# Patient Record
Sex: Female | Born: 1975 | Race: White | Hispanic: No | Marital: Single | Smoking: Never smoker
Health system: Southern US, Community
[De-identification: ages and names within clinical notes are randomized; demographics above are authoritative.]

## PROBLEM LIST (undated history)

## (undated) DIAGNOSIS — R112 Nausea with vomiting, unspecified: Secondary | ICD-10-CM

## (undated) DIAGNOSIS — Z9889 Other specified postprocedural states: Secondary | ICD-10-CM

## (undated) DIAGNOSIS — K219 Gastro-esophageal reflux disease without esophagitis: Secondary | ICD-10-CM

## (undated) DIAGNOSIS — T4145XA Adverse effect of unspecified anesthetic, initial encounter: Secondary | ICD-10-CM

## (undated) DIAGNOSIS — T8859XA Other complications of anesthesia, initial encounter: Secondary | ICD-10-CM

## (undated) HISTORY — PX: CHOLECYSTECTOMY: SHX55

---

## 1999-07-24 ENCOUNTER — Other Ambulatory Visit: Admission: RE | Admit: 1999-07-24 | Discharge: 1999-07-24 | Payer: Self-pay | Admitting: Obstetrics and Gynecology

## 2000-08-01 ENCOUNTER — Other Ambulatory Visit: Admission: RE | Admit: 2000-08-01 | Discharge: 2000-08-01 | Payer: Self-pay | Admitting: Obstetrics and Gynecology

## 2001-08-07 ENCOUNTER — Other Ambulatory Visit: Admission: RE | Admit: 2001-08-07 | Discharge: 2001-08-07 | Payer: Self-pay | Admitting: Obstetrics and Gynecology

## 2002-08-17 ENCOUNTER — Other Ambulatory Visit: Admission: RE | Admit: 2002-08-17 | Discharge: 2002-08-17 | Payer: Self-pay | Admitting: Obstetrics and Gynecology

## 2003-05-04 ENCOUNTER — Encounter: Admission: RE | Admit: 2003-05-04 | Discharge: 2003-05-04 | Payer: Self-pay | Admitting: Plastic Surgery

## 2003-09-02 ENCOUNTER — Other Ambulatory Visit: Admission: RE | Admit: 2003-09-02 | Discharge: 2003-09-02 | Payer: Self-pay | Admitting: Obstetrics and Gynecology

## 2004-03-22 ENCOUNTER — Inpatient Hospital Stay (HOSPITAL_COMMUNITY): Admission: AD | Admit: 2004-03-22 | Discharge: 2004-03-22 | Payer: Self-pay | Admitting: Obstetrics and Gynecology

## 2004-03-23 ENCOUNTER — Ambulatory Visit (HOSPITAL_COMMUNITY): Admission: RE | Admit: 2004-03-23 | Discharge: 2004-03-23 | Payer: Self-pay | Admitting: Obstetrics and Gynecology

## 2004-04-11 ENCOUNTER — Inpatient Hospital Stay (HOSPITAL_COMMUNITY): Admission: AD | Admit: 2004-04-11 | Discharge: 2004-04-11 | Payer: Self-pay | Admitting: Obstetrics and Gynecology

## 2004-06-28 ENCOUNTER — Inpatient Hospital Stay (HOSPITAL_COMMUNITY): Admission: RE | Admit: 2004-06-28 | Discharge: 2004-07-01 | Payer: Self-pay | Admitting: Obstetrics and Gynecology

## 2004-07-02 ENCOUNTER — Encounter: Admission: RE | Admit: 2004-07-02 | Discharge: 2004-07-30 | Payer: Self-pay | Admitting: Obstetrics and Gynecology

## 2004-08-07 ENCOUNTER — Other Ambulatory Visit: Admission: RE | Admit: 2004-08-07 | Discharge: 2004-08-07 | Payer: Self-pay | Admitting: Obstetrics and Gynecology

## 2004-10-07 IMAGING — US US ABDOMEN COMPLETE
1 series · 14 of 25 positions shown · non-contrast
Comparison: none

CLINICAL DATA: Abdominal pain and vomiting.
 ABDOMINAL ULTRASOUND
 Normal gallbladder.  Common duct measures 3 mm in diameter which is well within normal limits.  No hepatic or splenic abnormalities.  The spleen measures 12 cm in length.  No definite pancreatic abnormality.  The pancreatic tail was obscured and therefore could not be examined.  No ascites.  Right and left kidneys measure 10.9 and 11.4 cm in length respectively.  Normal caliber of the abdominal aorta.  
 IMPRESSION
 Unremarkable abdominal ultrasound as described above.

[Series 1: unknown · 0.25mm/px · 14 of 42 slices shown]
[im 1/42]
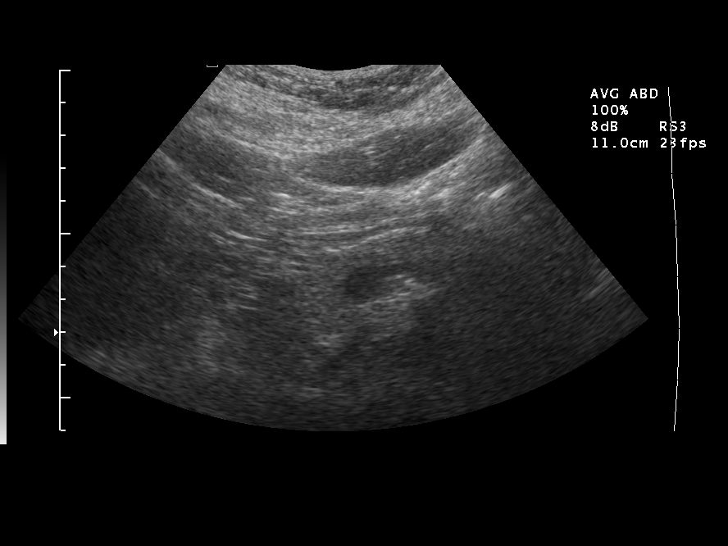
[im 4/42]
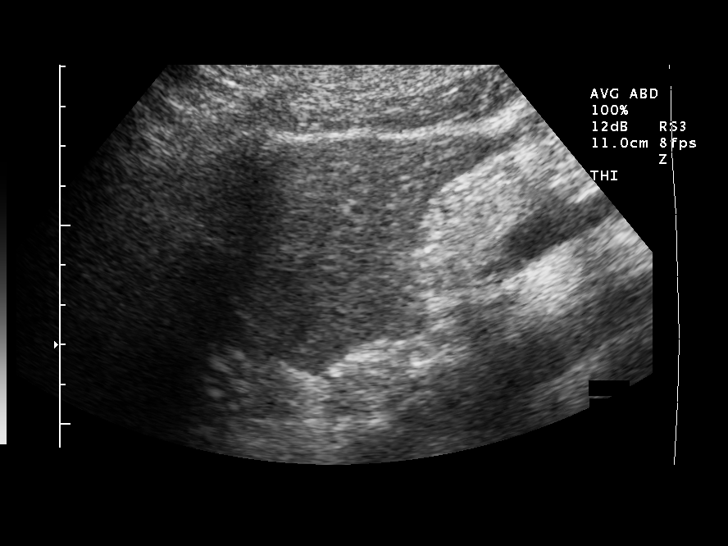
[im 7/42]
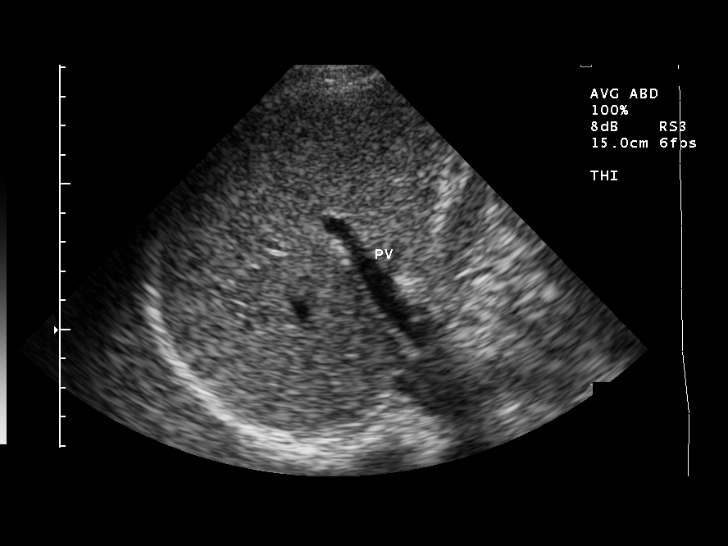
[im 11/42]
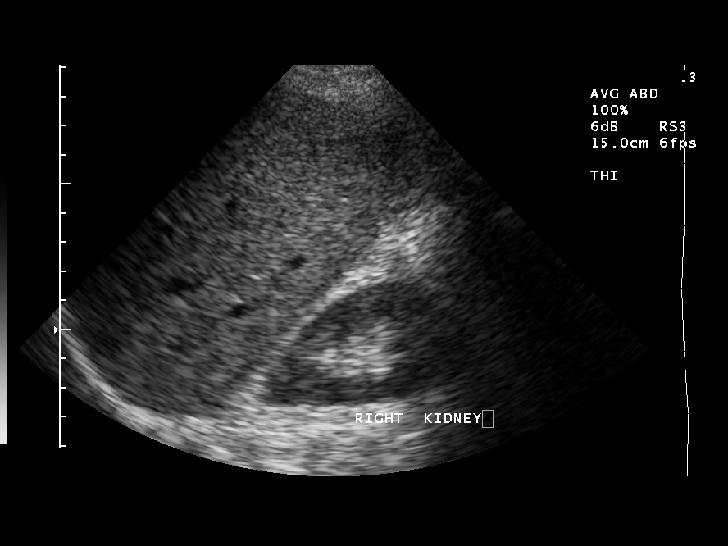
[im 14/42]
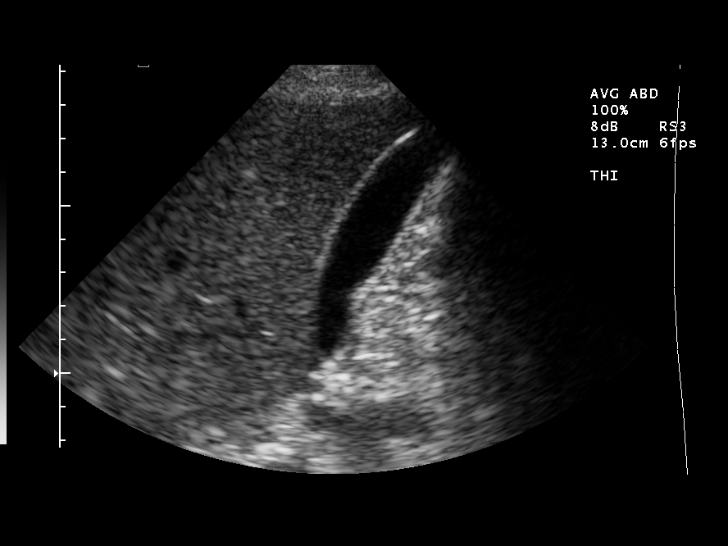
[im 16/42]
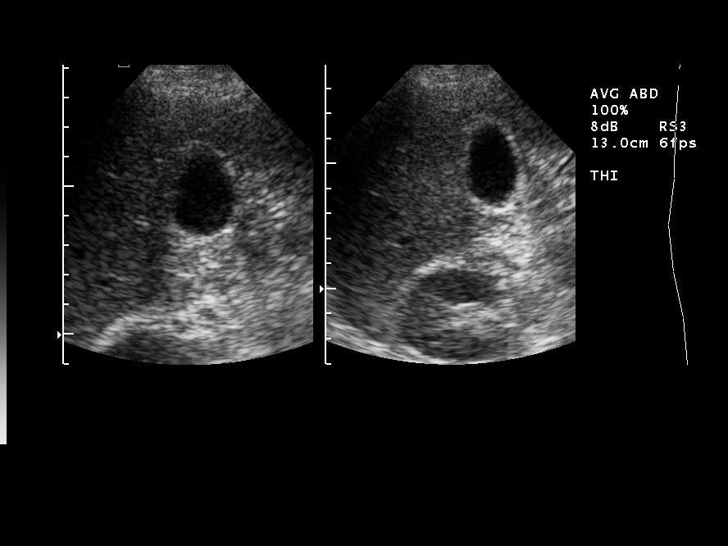
[im 19/42]
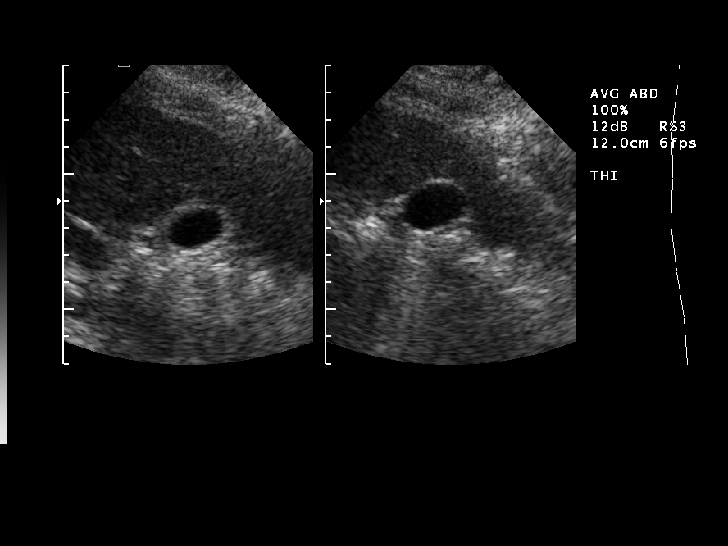
[im 23/42]
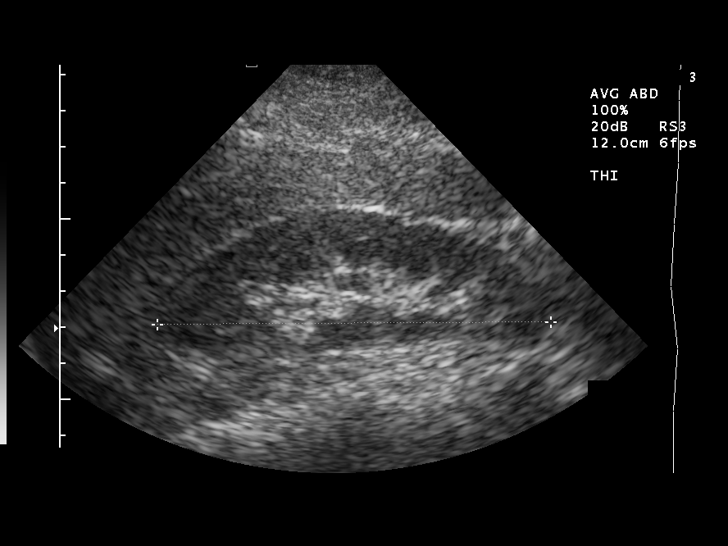
[im 26/42]
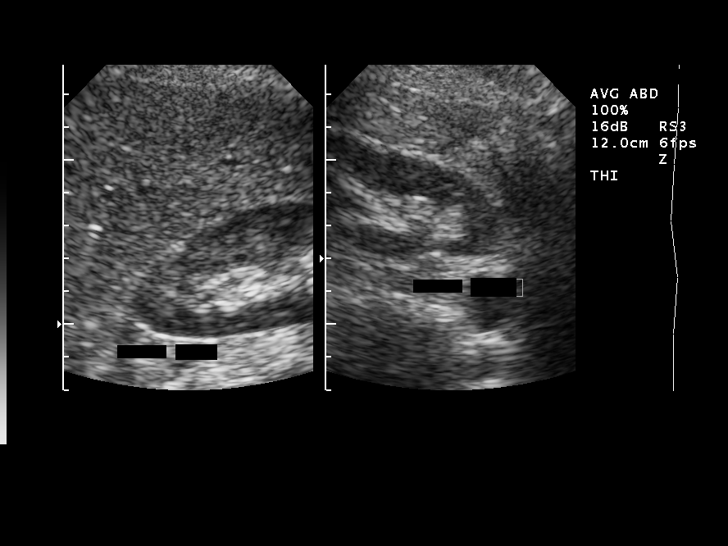
[im 28/42]
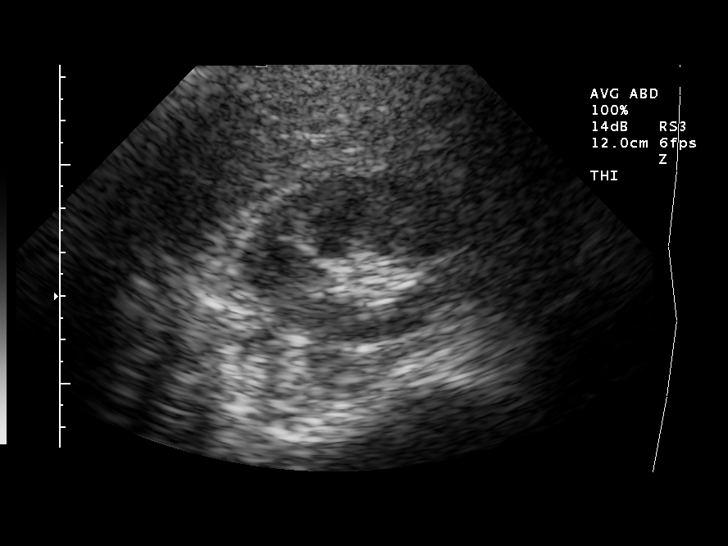
[im 31/42]
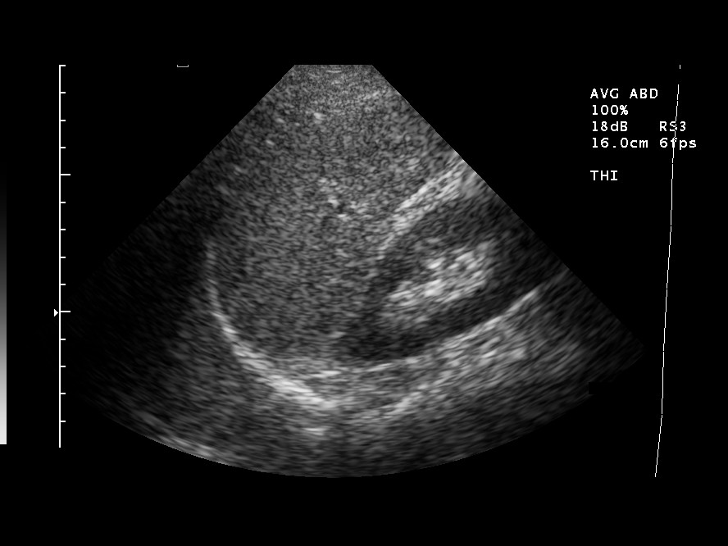
[im 35/42]
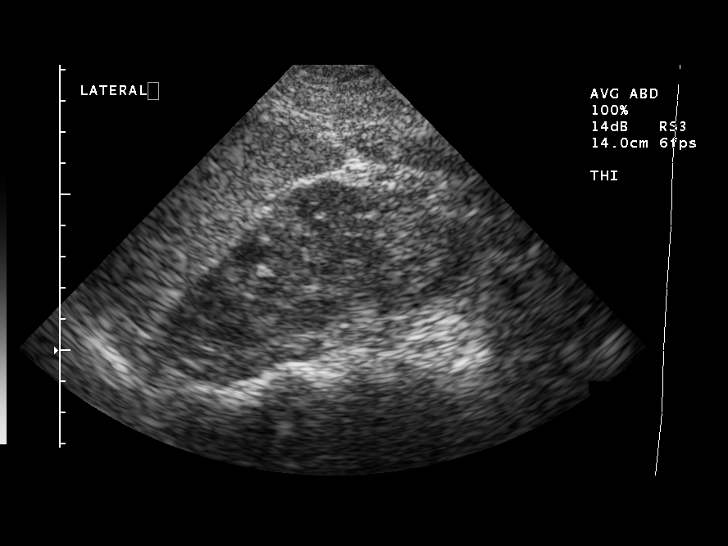
[im 38/42]
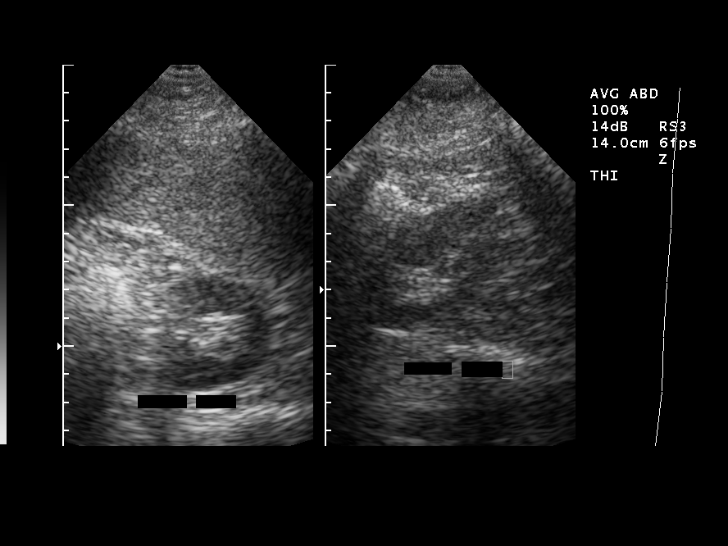
[im 42/42]
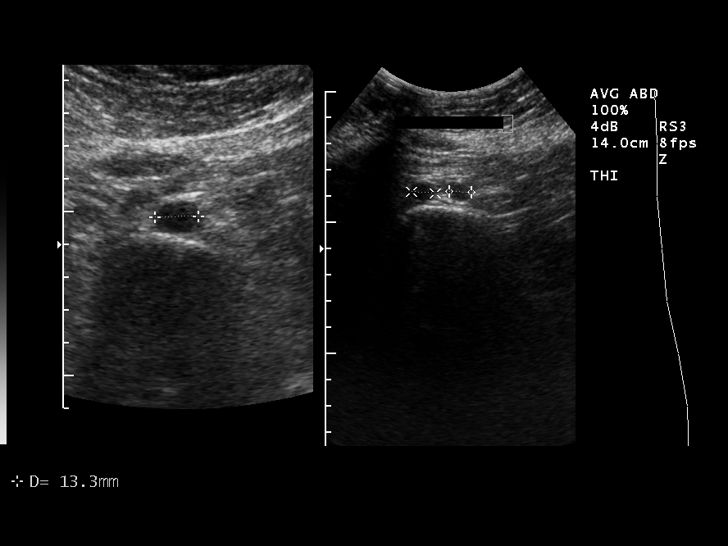

[14 of 25 positions shown; findings below may reference images not displayed]

## 2005-09-15 IMAGING — US US FETAL BPP W/O NONSTRESS
1 series · 14 of 28 positions shown · non-contrast
Comparison: none

CLINICAL DATA: Decreased fetal movement.

[Series 1: us fetal bpp w/o nonstress · 0.35mm/px · 14 of 50 slices shown]
[im 2/50]
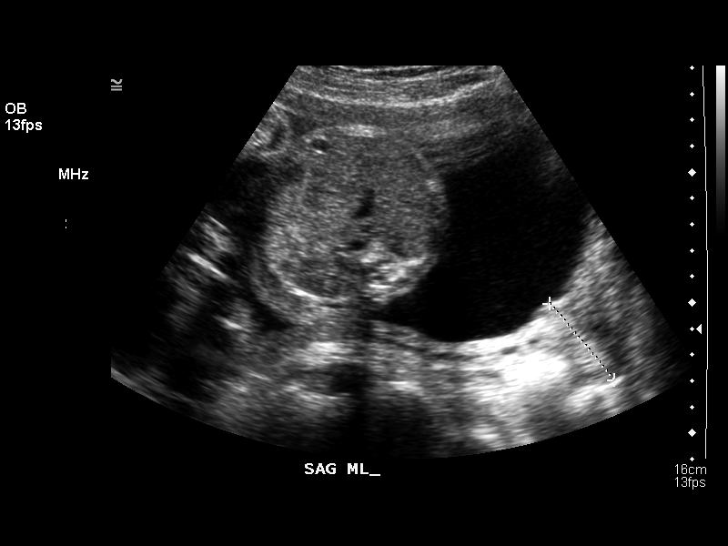
[im 6/50]
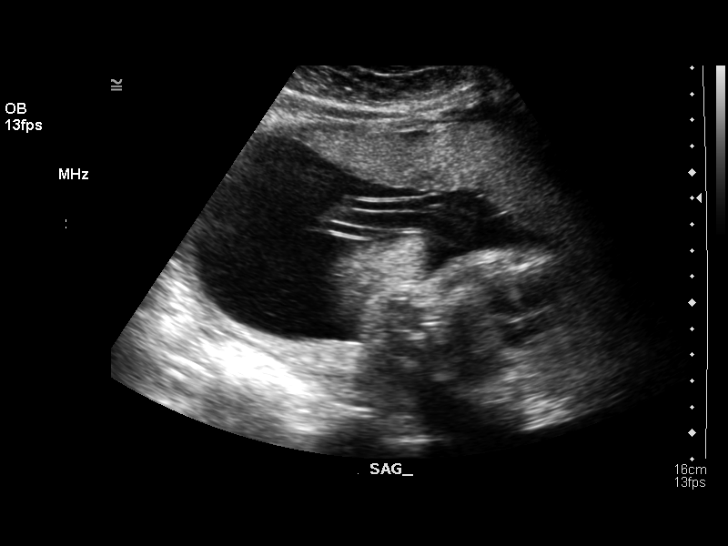
[im 10/50]
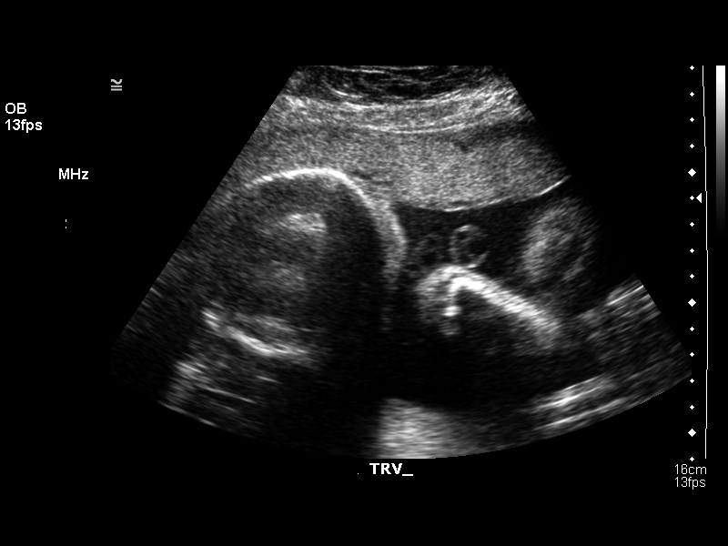
[im 13/50]
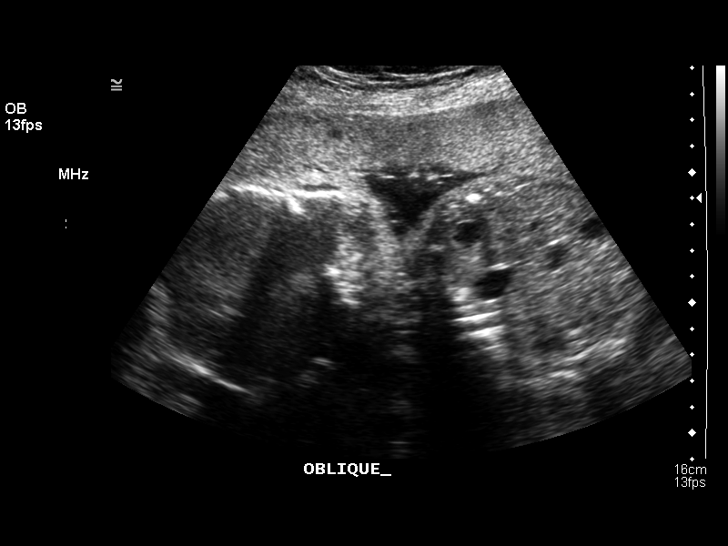
[im 17/50]
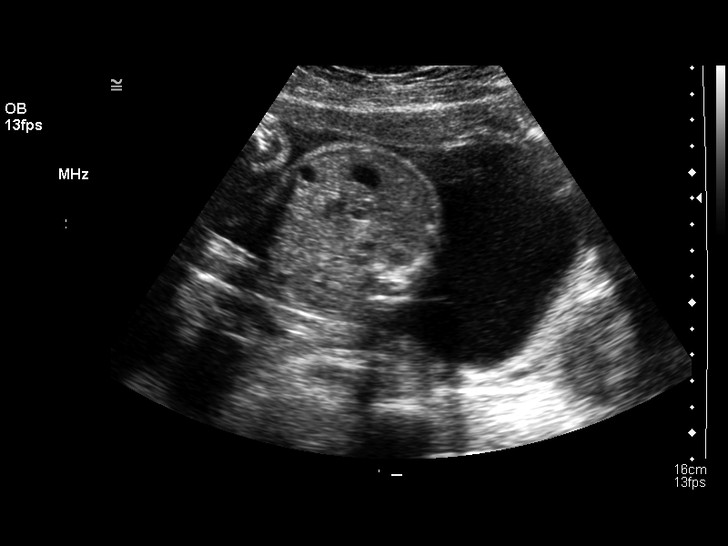
[im 20/50]
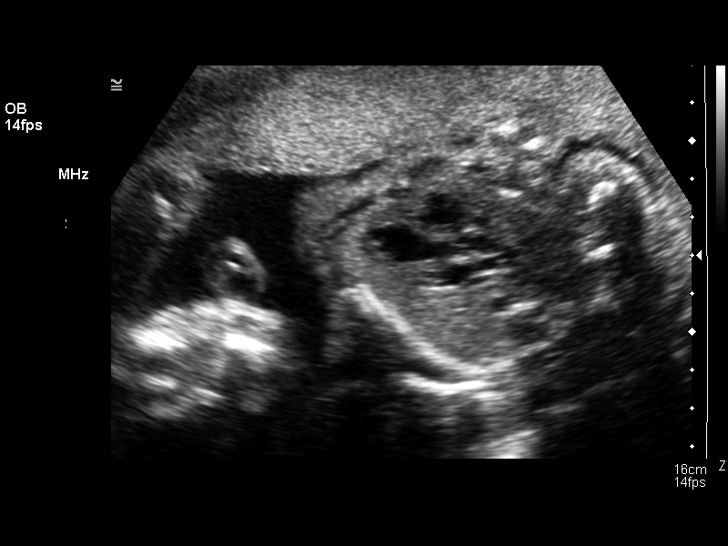
[im 24/50]
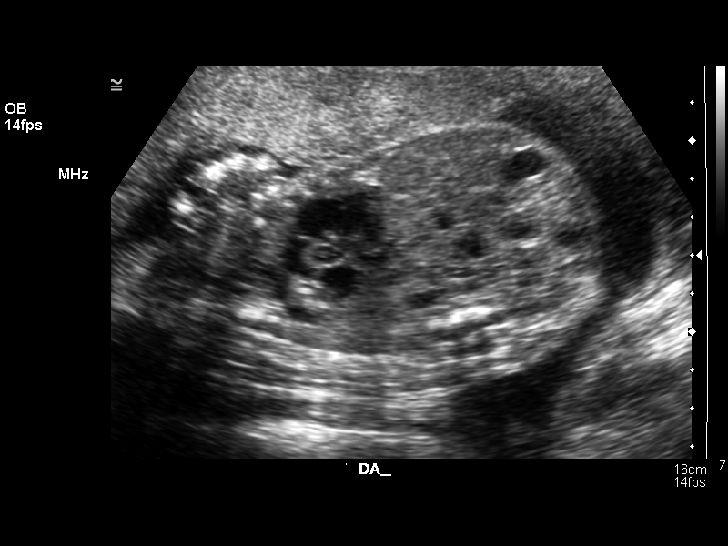
[im 28/50]
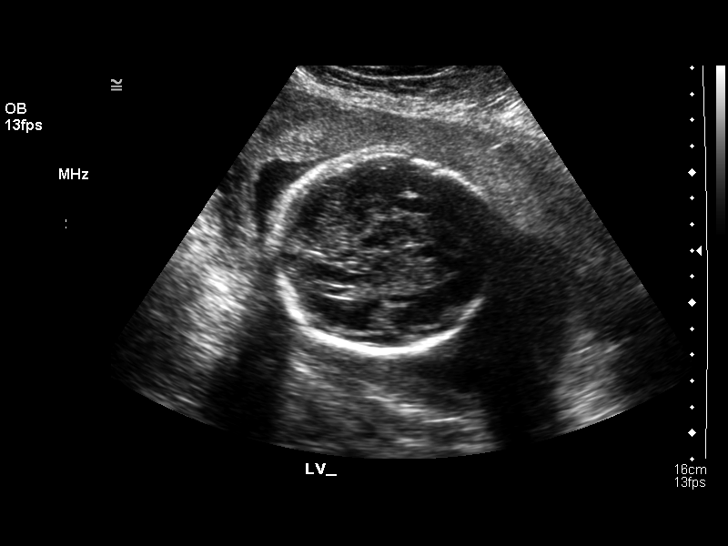
[im 31/50]
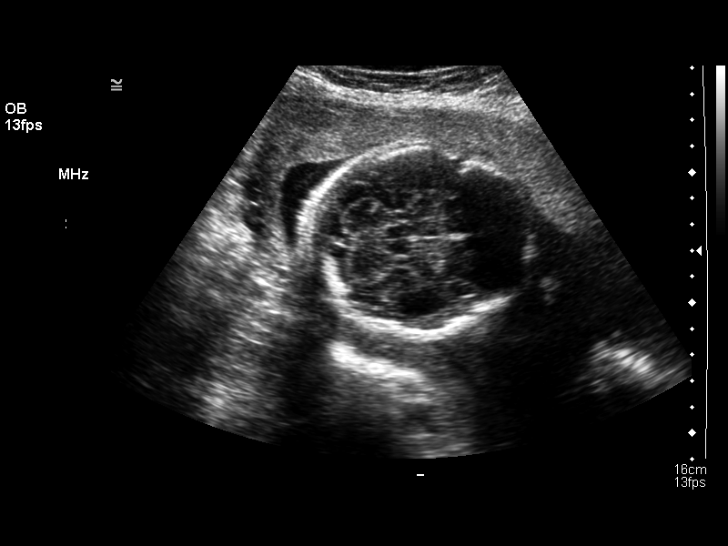
[im 35/50]
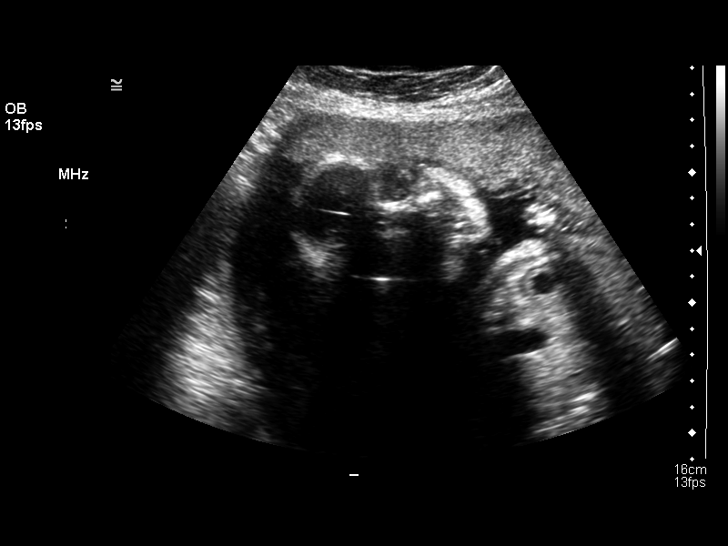
[im 39/50]
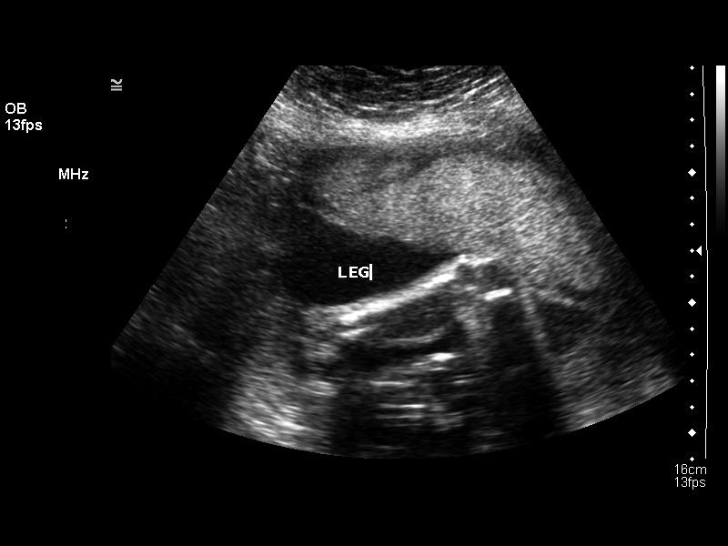
[im 42/50]
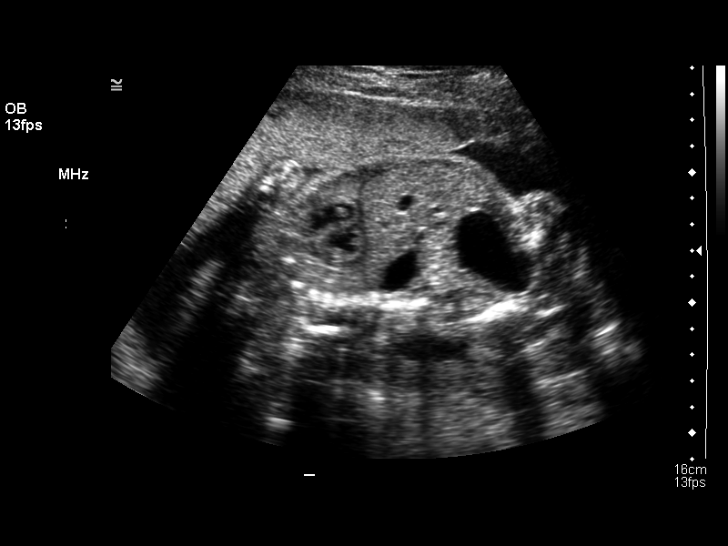
[im 46/50]
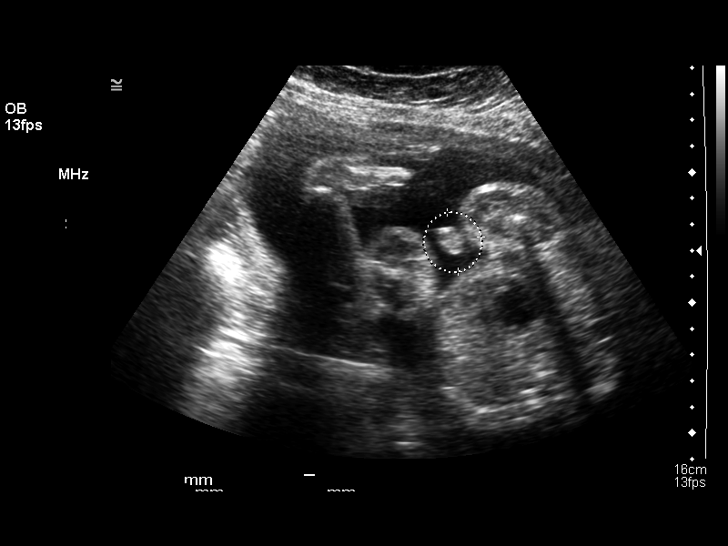
[im 50/50]
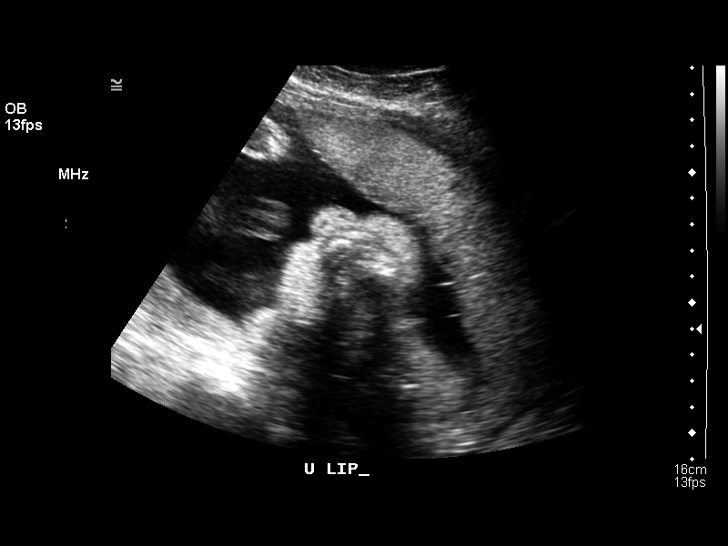

[14 of 28 positions shown; findings below may reference images not displayed]

BIOPHYSICAL PROFILE

 Number of Fetuses:  1
 Heart rate:  139
 Movement:  Yes
 Breathing:  Yes
 Presentation:  Oblique
 Placental Location:  Anterior
 Grade:  I
 Previa:  No
 Amniotic Fluid (Subjective):  Normal
 Amniotic Fluid (Objective):  19.0 cm AFI (5th -95th%ile = 9.4 ? 22.8 cm for 28 wks)

 Fetal measurements and complete anatomic evaluation were not requested.  The following fetal anatomy was visualized on this exam:  Lateral ventricles, thalami, posterior fossa, four chamber heart, stomach, 3-vessel cord, cord insertion site, kidneys, bladder, LVOT, RVOT, upper lip, orbits, profile, diaphragm, ductal arch, aortic arch, and male genitalia.

 BPP SCORING
 Movements:  2  Time:  20 minutes
 Breathing:  2
 Tone:  2
 Amniotic Fluid:  2
 Total Score:  8

 MATERNAL FINDINGS:
 Cervix:  4.0 cm Transabdominally
IMPRESSION: Normal biophysical profile score.

## 2005-09-20 ENCOUNTER — Other Ambulatory Visit: Admission: RE | Admit: 2005-09-20 | Discharge: 2005-09-20 | Payer: Self-pay | Admitting: Obstetrics and Gynecology

## 2010-03-10 ENCOUNTER — Emergency Department (HOSPITAL_COMMUNITY): Admission: EM | Admit: 2010-03-10 | Discharge: 2010-03-10 | Payer: Self-pay | Admitting: Emergency Medicine

## 2010-07-30 ENCOUNTER — Emergency Department (HOSPITAL_COMMUNITY): Payer: 59

## 2010-07-30 ENCOUNTER — Emergency Department (HOSPITAL_COMMUNITY)
Admission: EM | Admit: 2010-07-30 | Discharge: 2010-07-30 | Disposition: A | Discharge status: Home / Self Care | Payer: 59 | Attending: Emergency Medicine | Admitting: Emergency Medicine

## 2010-07-30 DIAGNOSIS — R110 Nausea: Secondary | ICD-10-CM | POA: Insufficient documentation

## 2010-07-30 DIAGNOSIS — R1011 Right upper quadrant pain: Secondary | ICD-10-CM | POA: Insufficient documentation

## 2010-07-30 LAB — URINALYSIS, ROUTINE W REFLEX MICROSCOPIC
Bilirubin Urine: NEGATIVE
Hgb urine dipstick: NEGATIVE
Ketones, ur: NEGATIVE mg/dL
Nitrite: NEGATIVE
Protein, ur: NEGATIVE mg/dL
Specific Gravity, Urine: 1.009 (ref 1.005–1.030)
Urine Glucose, Fasting: NEGATIVE mg/dL
Urobilinogen, UA: 0.2 mg/dL (ref 0.0–1.0)
pH: 6 (ref 5.0–8.0)

## 2010-07-30 LAB — COMPREHENSIVE METABOLIC PANEL
ALT: 16 U/L (ref 0–35)
AST: 16 U/L (ref 0–37)
Albumin: 3.5 g/dL (ref 3.5–5.2)
Alkaline Phosphatase: 49 U/L (ref 39–117)
BUN: 6 mg/dL (ref 6–23)
CO2: 24 mEq/L (ref 19–32)
Calcium: 8.4 mg/dL (ref 8.4–10.5)
Chloride: 111 mEq/L (ref 96–112)
Creatinine, Ser: 0.67 mg/dL (ref 0.4–1.2)
GFR calc Af Amer: >60 mL/min (ref >60)
GFR calc non Af Amer: >60 mL/min (ref >60)
Glucose, Bld: 87 mg/dL (ref 70–99)
Potassium: 4.3 mEq/L (ref 3.5–5.1)
Sodium: 140 mEq/L (ref 135–145)
Total Bilirubin: 0.4 mg/dL (ref 0.3–1.2)
Total Protein: 6.4 g/dL (ref 6.0–8.3)

## 2010-07-30 LAB — POCT PREGNANCY, URINE: Preg Test, Ur: NEGATIVE

## 2010-07-30 LAB — LIPASE, BLOOD: Lipase: 28 U/L (ref 11–59)

## 2010-07-31 LAB — POCT I-STAT, CHEM 8
BUN: 6 mg/dL (ref 6–23)
Calcium, Ion: 1.11 mmol/L — ABNORMAL LOW (ref 1.12–1.32)
Chloride: 105 mEq/L (ref 96–112)
Creatinine, Ser: 0.6 mg/dL (ref 0.4–1.2)
Glucose, Bld: 104 mg/dL — ABNORMAL HIGH (ref 70–99)
HCT: 39 % (ref 36.0–46.0)
Hemoglobin: 13.3 g/dL (ref 12.0–15.0)
Potassium: 3.4 mEq/L — ABNORMAL LOW (ref 3.5–5.1)
Sodium: 140 mEq/L (ref 135–145)
TCO2: 21 mmol/L (ref 0–100)

## 2010-07-31 LAB — CBC
HCT: 36.9 % (ref 36.0–46.0)
Hemoglobin: 13.1 g/dL (ref 12.0–15.0)
MCH: 32.3 pg (ref 26.0–34.0)
MCHC: 35.5 g/dL (ref 30.0–36.0)
MCV: 91.1 fL (ref 78.0–100.0)
Platelets: 231 10*3/uL (ref 150–400)
RBC: 4.05 MIL/uL (ref 3.87–5.11)
RDW: 12.1 % (ref 11.5–15.5)
WBC: 7 10*3/uL (ref 4.0–10.5)

## 2010-07-31 LAB — DIFFERENTIAL
Basophils Absolute: 0 10*3/uL (ref 0.0–0.1)
Basophils Relative: 0 % (ref 0–1)
Eosinophils Absolute: 0 10*3/uL (ref 0.0–0.7)
Eosinophils Relative: 0 % (ref 0–5)
Lymphocytes Relative: 23 % (ref 12–46)
Lymphs Abs: 1.6 10*3/uL (ref 0.7–4.0)
Monocytes Absolute: 0.5 10*3/uL (ref 0.1–1.0)
Monocytes Relative: 7 % (ref 3–12)
Neutro Abs: 4.9 10*3/uL (ref 1.7–7.7)
Neutrophils Relative %: 70 % (ref 43–77)

## 2010-08-07 ENCOUNTER — Other Ambulatory Visit (HOSPITAL_COMMUNITY): Payer: Self-pay | Admitting: Family Medicine

## 2010-08-07 DIAGNOSIS — R112 Nausea with vomiting, unspecified: Secondary | ICD-10-CM

## 2010-08-07 DIAGNOSIS — R1011 Right upper quadrant pain: Secondary | ICD-10-CM

## 2010-08-15 ENCOUNTER — Ambulatory Visit (HOSPITAL_COMMUNITY)
Admission: RE | Admit: 2010-08-15 | Discharge: 2010-08-15 | Disposition: A | Discharge status: Home / Self Care | Payer: 59 | Source: Ambulatory Visit | Attending: Family Medicine | Admitting: Family Medicine

## 2010-08-15 DIAGNOSIS — R109 Unspecified abdominal pain: Secondary | ICD-10-CM | POA: Insufficient documentation

## 2010-08-15 DIAGNOSIS — R948 Abnormal results of function studies of other organs and systems: Secondary | ICD-10-CM | POA: Insufficient documentation

## 2010-08-15 DIAGNOSIS — R112 Nausea with vomiting, unspecified: Secondary | ICD-10-CM

## 2010-08-15 DIAGNOSIS — R1011 Right upper quadrant pain: Secondary | ICD-10-CM

## 2010-08-15 MED ORDER — TECHNETIUM TC 99M MEBROFENIN IV KIT
5.0000 | PACK | Freq: Once | INTRAVENOUS | Status: AC | PRN
  Administered 2010-08-15: 5 via INTRAVENOUS

## 2010-09-06 LAB — COMPREHENSIVE METABOLIC PANEL
ALT: 20 U/L (ref 0–35)
AST: 25 U/L (ref 0–37)
Albumin: 4.2 g/dL (ref 3.5–5.2)
Alkaline Phosphatase: 72 U/L (ref 39–117)
BUN: 11 mg/dL (ref 6–23)
CO2: 22 mEq/L (ref 19–32)
Calcium: 8.7 mg/dL (ref 8.4–10.5)
Chloride: 109 mEq/L (ref 96–112)
Creatinine, Ser: 0.57 mg/dL (ref 0.4–1.2)
GFR calc Af Amer: >60 mL/min (ref >60)
GFR calc non Af Amer: >60 mL/min (ref >60)
Glucose, Bld: 106 mg/dL — ABNORMAL HIGH (ref 70–99)
Potassium: 3.8 mEq/L (ref 3.5–5.1)
Sodium: 137 mEq/L (ref 135–145)
Total Bilirubin: 0.7 mg/dL (ref 0.3–1.2)
Total Protein: 7.8 g/dL (ref 6.0–8.3)

## 2010-09-06 LAB — URINALYSIS, ROUTINE W REFLEX MICROSCOPIC
Bilirubin Urine: NEGATIVE
Glucose, UA: NEGATIVE mg/dL
Hgb urine dipstick: NEGATIVE
Ketones, ur: NEGATIVE mg/dL
Nitrite: NEGATIVE
Protein, ur: NEGATIVE mg/dL
Specific Gravity, Urine: 1.024 (ref 1.005–1.030)
Urobilinogen, UA: 0.2 mg/dL (ref 0.0–1.0)
pH: 7 (ref 5.0–8.0)

## 2010-09-06 LAB — DIFFERENTIAL
Basophils Absolute: 0 10*3/uL (ref 0.0–0.1)
Basophils Relative: 0 % (ref 0–1)
Eosinophils Absolute: 0 10*3/uL (ref 0.0–0.7)
Eosinophils Relative: 0 % (ref 0–5)
Lymphocytes Relative: 12 % (ref 12–46)
Lymphs Abs: 1.2 10*3/uL (ref 0.7–4.0)
Monocytes Absolute: 0.4 10*3/uL (ref 0.1–1.0)
Monocytes Relative: 4 % (ref 3–12)
Neutro Abs: 8.1 10*3/uL — ABNORMAL HIGH (ref 1.7–7.7)
Neutrophils Relative %: 83 % — ABNORMAL HIGH (ref 43–77)

## 2010-09-06 LAB — CBC
HCT: 40.5 % (ref 36.0–46.0)
Hemoglobin: 14.2 g/dL (ref 12.0–15.0)
MCH: 33.2 pg (ref 26.0–34.0)
MCHC: 35 g/dL (ref 30.0–36.0)
MCV: 94.8 fL (ref 78.0–100.0)
Platelets: 214 10*3/uL (ref 150–400)
RBC: 4.27 MIL/uL (ref 3.87–5.11)
RDW: 11.9 % (ref 11.5–15.5)
WBC: 9.7 10*3/uL (ref 4.0–10.5)

## 2010-09-06 LAB — LIPASE, BLOOD: Lipase: 26 U/L (ref 11–59)

## 2010-09-06 LAB — URINE MICROSCOPIC-ADD ON

## 2010-09-06 LAB — POCT PREGNANCY, URINE: Preg Test, Ur: NEGATIVE

## 2010-11-09 NOTE — Discharge Summary (Signed)
Lydia Jones, Lydia Jones             ACCOUNT NO.:  [id number]   MEDICAL RECORD NO.:  [id number]          PATIENT TYPE:  INP   LOCATION:  9110                          FACILITY:  [facility]   PHYSICIAN:  Michelle L. Grewal, M.D.DATE OF BIRTH:  [birth year]   DATE OF ADMISSION:  06/28/2004  DATE OF DISCHARGE:  07/01/2004                                 DISCHARGE SUMMARY   ADMITTING DIAGNOSES:  1.  Intrauterine pregnancy at 38-and-a-half weeks estimated gestational age.  2.  History of lumbar disc pain.   DISCHARGE DIAGNOSES:  1.  Status post low transverse cesarean section.  2.  Viable female infant.   PROCEDURE:  Primary low transverse cesarean section.   REASON FOR ADMISSION:  Please see dictated H&P.   HOSPITAL COURSE:  The patient was a 35 year old white married female  primigravida that was admitted to [facility] for a  scheduled cesarean section.  The patient had history of a significant lumbar  disc pain and after consultation with her orthopedist, decision was made to  proceed with a primary cesarean delivery.  On the morning of admission, the  patient was taken to the operating room where spinal anesthesia was  administered without difficulty.  A low transverse incision was made with  the delivery of a viable female infant weighing 6 pounds 13 ounces with Apgars  of 9 at one minute and 9 at five minutes.  Arterial cord pH was 7.30.  The  patient tolerated the procedure well and was taken to the recovery room in  stable condition.  On postoperative day #1, vital signs were stable, she was  afebrile.  Abdomen was soft, fundus was firm and nontender.  Abdominal  dressing was noted to be clean, dry, and intact.  Labs revealed hemoglobin  of 8.9; platelet count 166,000; wbc count of 8.1.  On postoperative day #2,  the patient was without complaint.  Vital signs remained stable, she was  afebrile.  Abdomen was soft, fundus was firm and nontender.  Abdominal  dressing was  clean, dry, and intact.  On postoperative day #3, the patient  did complain of some soreness.  Otherwise, vital signs were stable, she was  afebrile.  Fundus was firm and nontender.  Incision was clean, dry, and  intact.  Staples were removed and the patient was discharged home.   CONDITION ON DISCHARGE:  Good.   DIET:  Regular as tolerated.   ACTIVITY:  No heavy lifting, no driving x2 weeks, no vaginal entry.   FOLLOW-UP:  The patient is to follow up in the office in 1 week for an  incision check.  She is to call for temperature greater than 100 degrees,  persistent nausea and vomiting, heavy vaginal bleeding, and/or redness or  drainage from the incisional site.   DISCHARGE MEDICATIONS:  1.  Tylox #30 one p.o. q.4-6h. p.r.n.  2.  Motrin 600 mg q.6h.  3.  Prenatal vitamins one p.o. daily.  4.  Colace one p.o. daily p.r.n.      CC/MEDQ  D:  07/25/2004  T:  07/25/2004  Job:  [id number]

## 2010-11-09 NOTE — Op Note (Signed)
NAMEHOLDEN, Lydia Jones             ACCOUNT NO.:  [id number]   MEDICAL RECORD NO.:  [id number]          PATIENT TYPE:  INP   LOCATION:  9199                          FACILITY:  [facility]   PHYSICIAN:  Guy Sandifer. Tomblin II, M.D.DATE OF BIRTH:  16-Apr-1976   DATE OF PROCEDURE:  06/29/2003  DATE OF DISCHARGE:                                 OPERATIVE REPORT   PREOPERATIVE DIAGNOSES:  1.  Intra-uterine pregnancy at 38-1/2 weeks estimated gestational age.  2.  History of lumbar disk pain.   POSTOPERATIVE DIAGNOSES:  1.  Intra-uterine pregnancy at 38-1/2 weeks estimated gestational age.  2.  History of lumbar disk pain.   PROCEDURE:  Low transverse cesarean section.   SURGEON:  Guy Sandifer. Henderson Cloud, M.D.   ANESTHESIA:  Spinal, Belva Agee, M.D.   ESTIMATED BLOOD LOSS:  600 cc.   FINDINGS:  Viable female infant.  Apgars of 9 and 9 at one and five minutes,  respectively.  Birth weight 6 pounds 13 ounces.  Arterial cord pH 7.30.   INDICATIONS AND CONSENT:  This patient is a 35 year old married, white  female G1, P0 with an EDC of July 07, 2004 confirmed by early exam and 18  week ultrasound  who has a history of significant lumbar disk pain.  After  consultation with the orthopedist, the decision is made to proceed with  primary cesarean section.  The potential risks and complications have been  discussed preoperatively including but not limited to infection, bowel,  bladder, ureteral damage, bleeding requiring transfusion of blood products,  possible transfusion reaction, HIV or hepatitis acquisition, deep venous  thrombosis, pulmonary embolism, pneumonia.  All questions were answered and  a consent is signed on the chart.   PROCEDURE:  The patient was taken to the operating room where she is  identified.  Spinal anesthetic is placed and she is placed in the dorsal  supine position with a 15 degree left lateral wedge.  She is then prepped.  The Foley catheter is placed and the bladder is  drained and she is draped in  a sterile fashion.  After testing for adequate spinal anesthesia, the skin  is entered through a Pfannenstiel incision and dissection is carried out in  layers through the peritoneum.  The peritoneum is incised and extended  superiorly and inferiorly.  The vesicouterine peritoneum is taken down  cephalolaterally.  The bladder flap is developed and the bladder blade is  placed.  The uterus is incised in the low transverse manner.  The uterine  cavity is entered bluntly with a hemostat.  The uterine incisions extended  cephalolaterally with the fingers.  Artificial rupture of membranes for  clear fluid is carried out.  The vertex is then delivered with the aid of a  tender-touch vacuum extractor with one gentle pull with no pop offs.  Oral  and nasopharynx are thoroughly suctioned.  The remainder of the baby is  delivered.  Good cry and tone is noted.  The cord is clamped and cut and the  baby is handed to the waiting pediatrics team.  The placenta is manually  delivered.  The cavity is clean.  The uterus is closed in two running  locking imbricating layers of 0 Monocryl suture.  An additional figure-of-  eight is used at the right one-third of the incision to obtain complete  hemostasis.  Tubes and  ovaries palpate normally.  The anterior rectus fascia is closed in a running  fashion with 0 PDS suture and the skin is closed with clips.  All sponge,  instrument and needle counts are correct and the patient is transferred to  the recovery room in stable condition.      JET/MEDQ  D:  06/28/2004  T:  06/28/2004  Job:  [id number]

## 2010-11-09 NOTE — H&P (Signed)
NAMECHRYSTINA, NAFF             ACCOUNT NO.:  [id number]   MEDICAL RECORD NO.:  [id number]          PATIENT TYPE:  INP   LOCATION:  NA                            FACILITY:  [facility]   PHYSICIAN:  Guy Sandifer. Tomblin II, M.D.DATE OF BIRTH:  [birth year]   DATE OF ADMISSION:  06/28/2004  DATE OF DISCHARGE:                                HISTORY & PHYSICAL   CHIEF COMPLAINT:  Desires cesarean section.   HISTORY OF PRESENT ILLNESS:  This patient is a 35 year old married white  female, G1, P0, with an EDC of July 07, 2004, confirmed by early exam and  18 week ultrasound, has a history of lumbar disc pain.  After consultation  with her orthopedist, cesarean section is scheduled.  The potential risks  and complications have been reviewed with the patient preoperatively.   Past medical history, past surgical history, family history, and obstetric  history, see prenatal history and physical.   MEDICATIONS:  Prenatal vitamins.   ALLERGIES:  Sulfa.   PHYSICAL EXAMINATION:  VITAL SIGNS:  Height 5 feet 4 inches, weight 180 pounds, blood pressure  120/62.  HEENT:  No thyromegaly.  LUNGS:  Clear to auscultation.  HEART:  Regular rate and rhythm.  BACK:  Without CVA tenderness.  BREASTS:  Not examined.  ABDOMEN:  Gravid, nontender.  GU:  Cervix closed, 30% effaced, -1 station, vertex.  EXTREMITIES:  1+ edema.  Deep tendon reflexes 2+ without clonus.   ASSESSMENT:  History of lumbar disc pain.   PLAN:  Cesarean section.     Jame   JET/MEDQ  D:  06/26/2004  T:  06/26/2004  Job:  [id number]

## 2011-08-14 IMAGING — US US ABDOMEN COMPLETE
1 series · 14 of 25 positions shown · non-contrast
Comparison: 03/23/2010

CLINICAL DATA: 34-year-old with abdominal pain.

COMPLETE ABDOMINAL ULTRASOUND

[Series 1: us abdomen complete · 0.31mm/px · 14 of 44 slices shown]
[im 1/44]
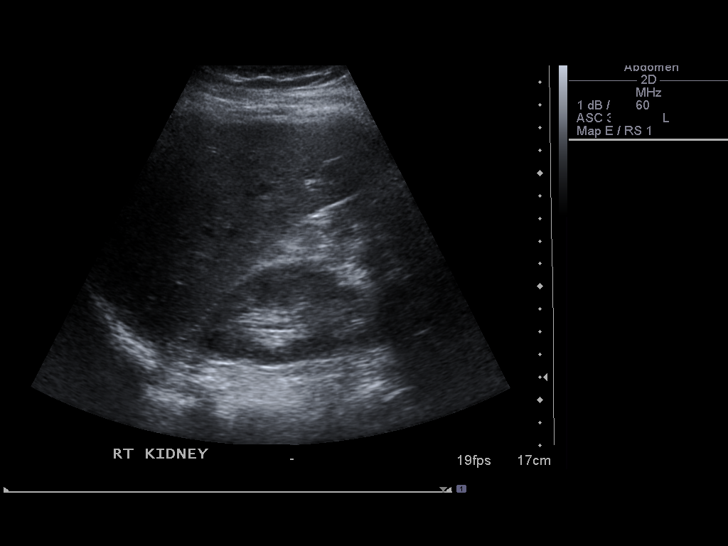
[im 4/44]
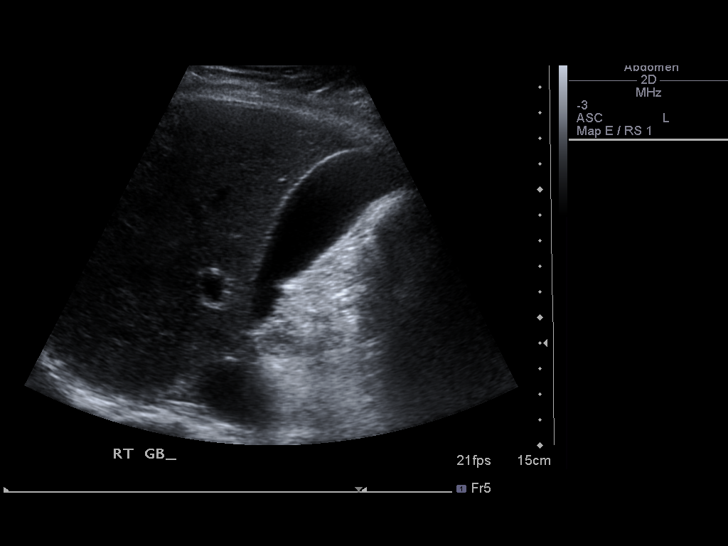
[im 8/44]
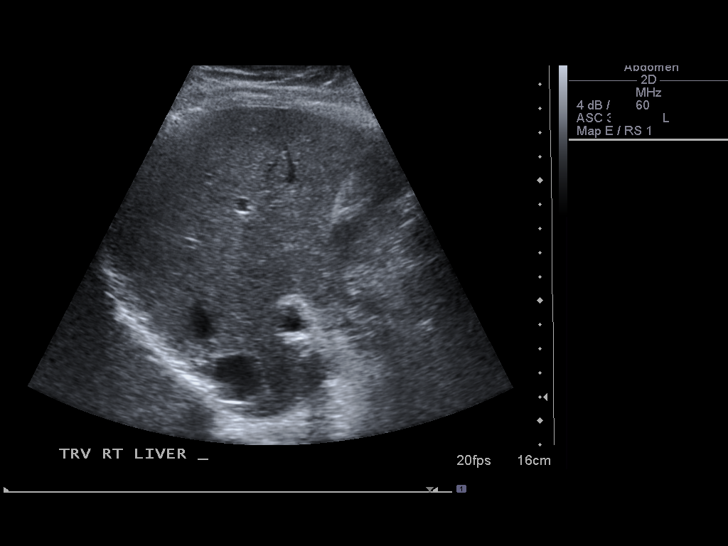
[im 11/44]
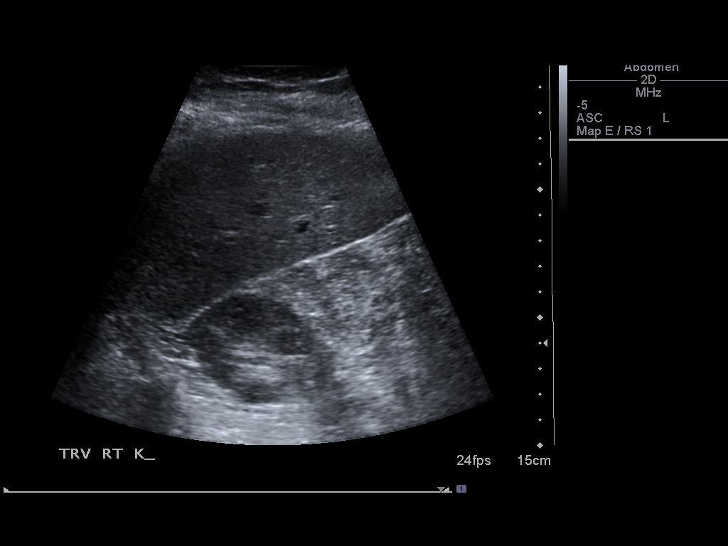
[im 15/44]
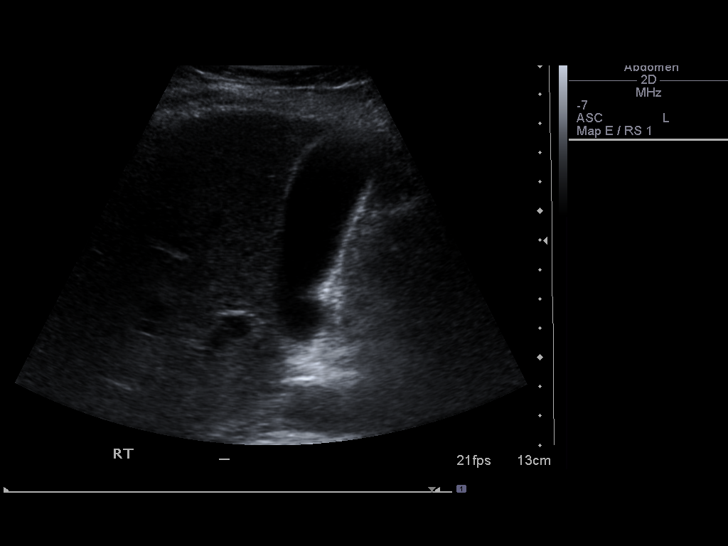
[im 17/44]
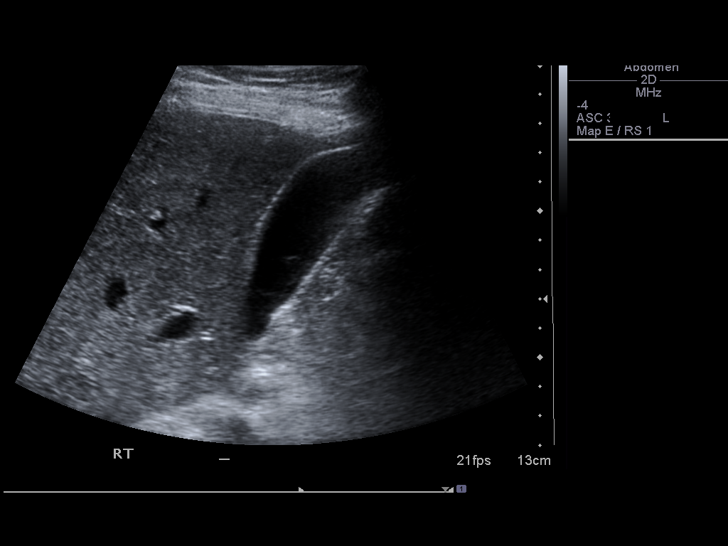
[im 20/44]
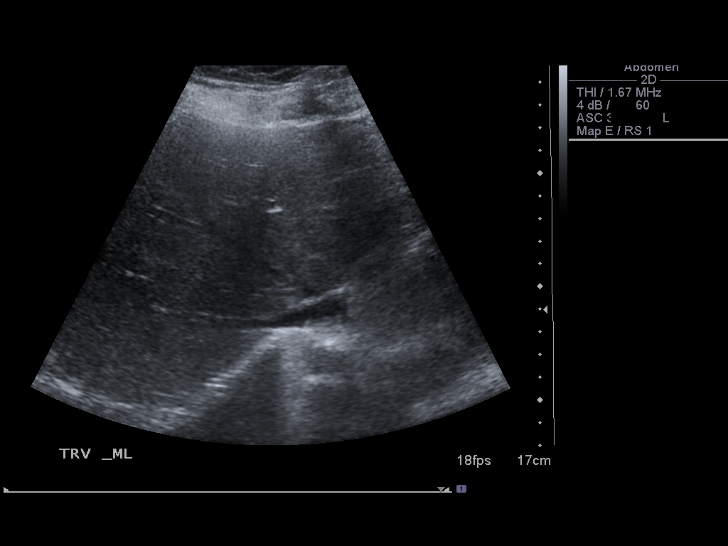
[im 24/44]
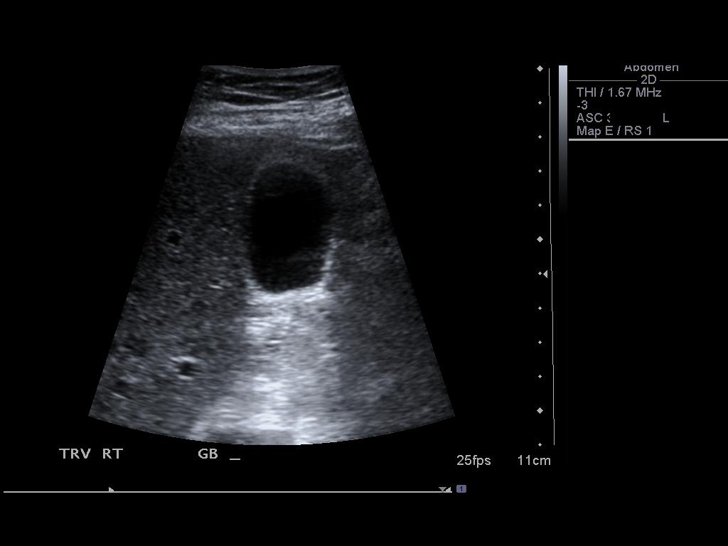
[im 27/44]
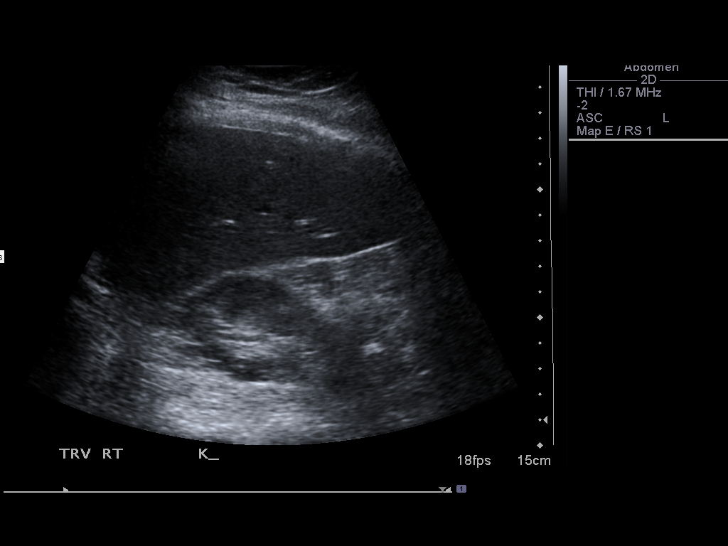
[im 29/44]
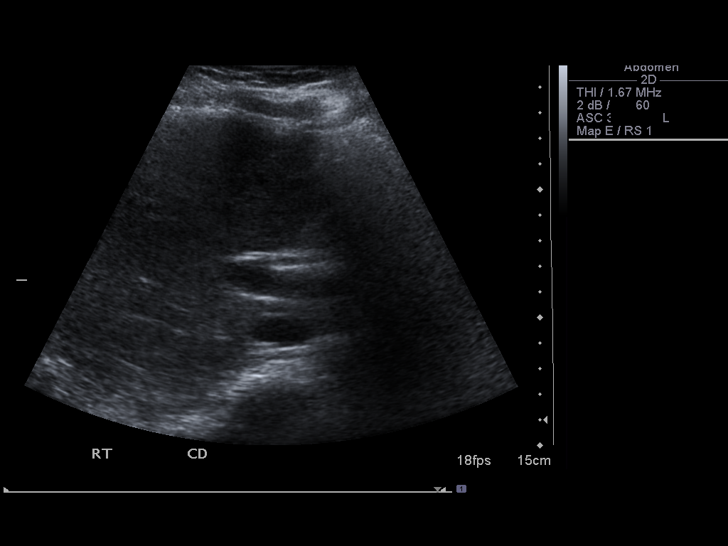
[im 33/44]
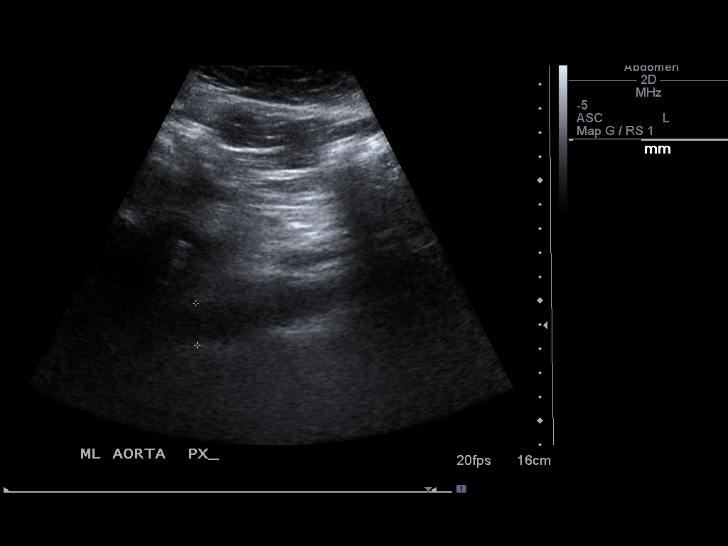
[im 36/44]
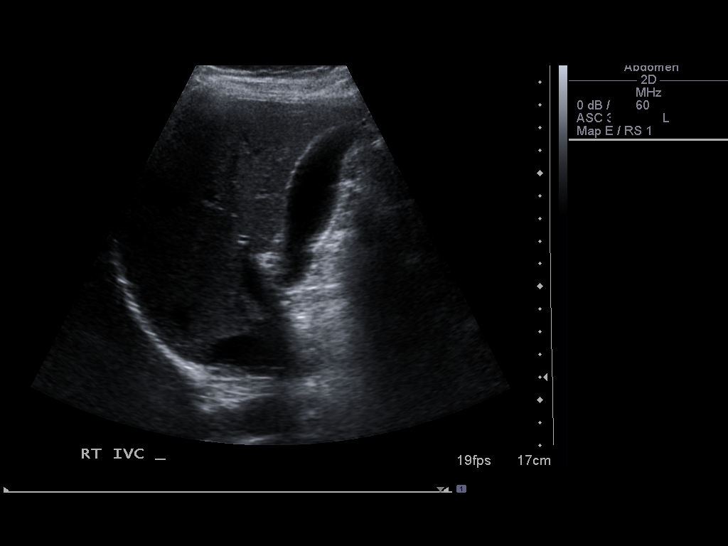
[im 40/44]
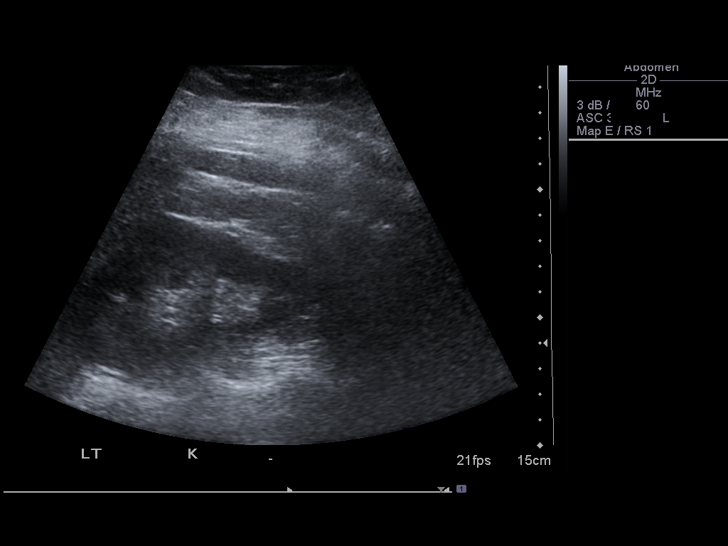
[im 44/44]
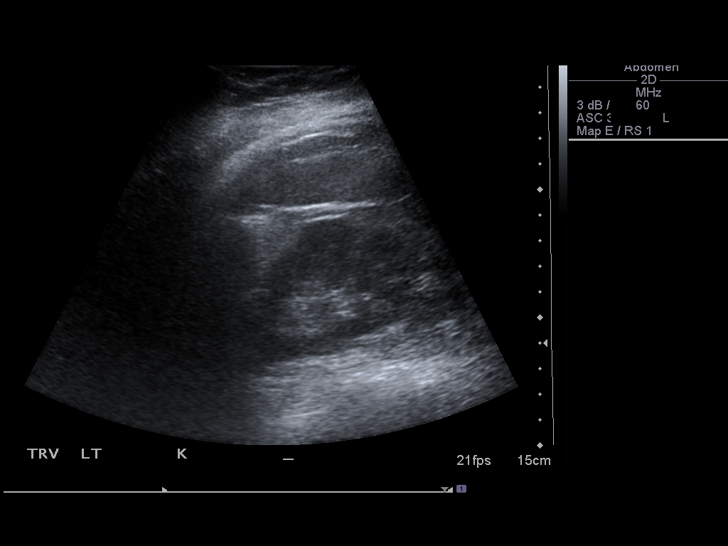

[14 of 25 positions shown; findings below may reference images not displayed]

FINDINGS: Gallbladder:  No gallstones, gallbladder wall thickening, or
pericholecystic fluid. No evidence for a sonographic Murphy's sign.

Common bile duct:  Measures 0.4 cm.

Liver:  No focal lesion identified.  Within normal limits in
parenchymal echogenicity.

IVC:  Appears normal.

Pancreas:  No gross abnormality to the pancreatic head or body.
The tail of the pancreas is obscured by bowel gas.

Spleen:  Spleen measures 9.8 cm in length.

Right Kidney:  Right kidney measures 10.5 cm in length without
hydronephrosis.  Normal echogenicity of the right kidney.

Left Kidney:  Left kidney has normal echogenicity and measures
cm.  Negative for left hydronephrosis.

Abdominal aorta:  No aneurysm identified.
IMPRESSION: Negative abdominal ultrasound.

## 2013-07-23 ENCOUNTER — Emergency Department (HOSPITAL_COMMUNITY): Payer: 59

## 2013-07-23 ENCOUNTER — Emergency Department (HOSPITAL_COMMUNITY)
Admission: EM | Admit: 2013-07-23 | Discharge: 2013-07-23 | Disposition: A | Discharge status: Home / Self Care | Payer: 59 | Attending: Emergency Medicine | Admitting: Emergency Medicine

## 2013-07-23 ENCOUNTER — Encounter (HOSPITAL_COMMUNITY): Payer: Self-pay | Admitting: Emergency Medicine

## 2013-07-23 DIAGNOSIS — R1013 Epigastric pain: Secondary | ICD-10-CM | POA: Insufficient documentation

## 2013-07-23 DIAGNOSIS — R1110 Vomiting, unspecified: Secondary | ICD-10-CM

## 2013-07-23 DIAGNOSIS — K5900 Constipation, unspecified: Secondary | ICD-10-CM | POA: Insufficient documentation

## 2013-07-23 DIAGNOSIS — R109 Unspecified abdominal pain: Secondary | ICD-10-CM

## 2013-07-23 DIAGNOSIS — R112 Nausea with vomiting, unspecified: Secondary | ICD-10-CM | POA: Insufficient documentation

## 2013-07-23 DIAGNOSIS — Z3202 Encounter for pregnancy test, result negative: Secondary | ICD-10-CM | POA: Insufficient documentation

## 2013-07-23 HISTORY — DX: Gastro-esophageal reflux disease without esophagitis: K21.9

## 2013-07-23 LAB — BASIC METABOLIC PANEL
BUN: 6 mg/dL (ref 6–23)
CO2: 23 mEq/L (ref 19–32)
Calcium: 9.6 mg/dL (ref 8.4–10.5)
Chloride: 102 mEq/L (ref 96–112)
Creatinine, Ser: 0.61 mg/dL (ref 0.50–1.10)
GFR calc Af Amer: >90 mL/min (ref >90)
GFR calc non Af Amer: >90 mL/min (ref >90)
Glucose, Bld: 79 mg/dL (ref 70–99)
Potassium: 4.1 mEq/L (ref 3.7–5.3)
Sodium: 138 mEq/L (ref 137–147)

## 2013-07-23 LAB — CBC WITH DIFFERENTIAL/PLATELET
Basophils Absolute: 0 10*3/uL (ref 0.0–0.1)
Basophils Relative: 0 % (ref 0–1)
Eosinophils Absolute: 0 10*3/uL (ref 0.0–0.7)
Eosinophils Relative: 1 % (ref 0–5)
HCT: 37 % (ref 36.0–46.0)
Hemoglobin: 13.3 g/dL (ref 12.0–15.0)
Lymphocytes Relative: 35 % (ref 12–46)
Lymphs Abs: 1.9 10*3/uL (ref 0.7–4.0)
MCH: 33.1 pg (ref 26.0–34.0)
MCHC: 35.9 g/dL (ref 30.0–36.0)
MCV: 92 fL (ref 78.0–100.0)
Monocytes Absolute: 0.4 10*3/uL (ref 0.1–1.0)
Monocytes Relative: 8 % (ref 3–12)
Neutro Abs: 3.1 10*3/uL (ref 1.7–7.7)
Neutrophils Relative %: 57 % (ref 43–77)
Platelets: 213 10*3/uL (ref 150–400)
RBC: 4.02 MIL/uL (ref 3.87–5.11)
RDW: 12.1 % (ref 11.5–15.5)
WBC: 5.5 10*3/uL (ref 4.0–10.5)

## 2013-07-23 LAB — URINALYSIS, ROUTINE W REFLEX MICROSCOPIC
Bilirubin Urine: NEGATIVE
Glucose, UA: NEGATIVE mg/dL
HGB URINE DIPSTICK: NEGATIVE
Ketones, ur: NEGATIVE mg/dL
LEUKOCYTES UA: NEGATIVE
NITRITE: NEGATIVE
PROTEIN: NEGATIVE mg/dL
SPECIFIC GRAVITY, URINE: 1.009 (ref 1.005–1.030)
UROBILINOGEN UA: 0.2 mg/dL (ref 0.0–1.0)
pH: 6 (ref 5.0–8.0)

## 2013-07-23 LAB — HEPATIC FUNCTION PANEL
ALT: 12 U/L (ref 0–35)
AST: 14 U/L (ref 0–37)
Albumin: 3.9 g/dL (ref 3.5–5.2)
Alkaline Phosphatase: 63 U/L (ref 39–117)
Total Bilirubin: 0.5 mg/dL (ref 0.3–1.2)
Total Protein: 7.5 g/dL (ref 6.0–8.3)

## 2013-07-23 LAB — LIPASE, BLOOD: Lipase: 23 U/L (ref 11–59)

## 2013-07-23 LAB — PREGNANCY, URINE: PREG TEST UR: NEGATIVE

## 2013-07-23 MED ORDER — ONDANSETRON 4 MG PO TBDP: 4.0000 mg | ORAL_TABLET | Freq: Three times a day (TID) | ORAL | Status: DC | PRN

## 2013-07-23 MED ORDER — OXYCODONE-ACETAMINOPHEN 5-325 MG PO TABS: 1.0000 | ORAL_TABLET | ORAL | Status: DC | PRN

## 2013-07-23 MED ORDER — HYDROMORPHONE HCL PF 1 MG/ML IJ SOLN
1.0000 mg | Freq: Once | INTRAMUSCULAR | Status: AC
  Administered 2013-07-23: 1 mg via INTRAVENOUS
  Filled 2013-07-23: qty 1

## 2013-07-23 MED ORDER — ONDANSETRON HCL 4 MG/2ML IJ SOLN
4.0000 mg | Freq: Once | INTRAMUSCULAR | Status: AC
  Administered 2013-07-23: 4 mg via INTRAVENOUS
  Filled 2013-07-23: qty 2

## 2013-07-23 MED ORDER — MORPHINE SULFATE 4 MG/ML IJ SOLN
4.0000 mg | Freq: Once | INTRAMUSCULAR | Status: AC
  Administered 2013-07-23: 4 mg via INTRAVENOUS
  Filled 2013-07-23: qty 1

## 2013-07-23 MED ORDER — SODIUM CHLORIDE 0.9 % IV BOLUS (SEPSIS)
1000.0000 mL | Freq: Once | INTRAVENOUS | Status: AC
  Administered 2013-07-23: 1000 mL via INTRAVENOUS

## 2013-07-23 MED ORDER — PROMETHAZINE HCL 25 MG RE SUPP: 25.0000 mg | Freq: Four times a day (QID) | RECTAL | Status: DC | PRN

## 2013-07-23 NOTE — ED Notes (Signed)
Family (mom)at bedside.

## 2013-07-23 NOTE — ED Provider Notes (Signed)
CSN: [id number]     Arrival date & time 07/23/13  0902 History   First MD Initiated Contact with Patient 07/23/13 [phone]     Chief Complaint  Patient presents with  . Abdominal Pain   (Consider location/radiation/quality/duration/timing/severity/associated sxs/prior Treatment) HPI Comments: Lydia Jones is a 38year-old female with a past medical history of Acid reflux, , presenting the Emergency Department with a chief complaint of epigastric pain for 2 days.  The patient describes the discomfort as non-radiating, intermittent cramping discomfort. She reports nausea with one episode of non-bloody emesis.  She states she is constipated, since her C-section.  Denies blood or pus in her stools.  Last BM was 1-2 days ago.  She denies aggravating factors including food.  She reports a history of "gall bladder problems" and elected not to have surgery due to insurance changes.     The history is provided by the patient and medical records. No language interpreter was used.    Past Medical History  Diagnosis Date  . Acid reflux    History reviewed. No pertinent past surgical history. No family history on file. History  Substance Use Topics  . Smoking status: Never Smoker   . Smokeless tobacco: Not on file  . Alcohol Use: No   OB History   Grav Para Term Preterm Abortions TAB SAB Ect Mult Living                 Review of Systems  Constitutional: Negative for fever and chills.  Respiratory: Negative for cough.   Cardiovascular: Negative for chest pain.  Gastrointestinal: Positive for nausea, vomiting, abdominal pain and constipation. Negative for blood in stool and anal bleeding.  Genitourinary: Negative for dysuria, urgency, hematuria, flank pain and vaginal bleeding.  Musculoskeletal: Negative for back pain.    Allergies  Sulfa antibiotics  Home Medications   Current Outpatient Rx  Name  Route  Sig  Dispense  Refill  . ondansetron (ZOFRAN ODT) 4 MG disintegrating tablet    Oral   Take 1 tablet (4 mg total) by mouth every 8 (eight) hours as needed for nausea or vomiting.   10 tablet   0   . oxyCODONE-acetaminophen (PERCOCET/ROXICET) 5-325 MG per tablet   Oral   Take 1 tablet by mouth every 4 (four) hours as needed for severe pain.   10 tablet   0   . promethazine (PHENERGAN) 25 MG suppository   Rectal   Place 1 suppository (25 mg total) rectally every 6 (six) hours as needed for nausea or vomiting.   12 each   0    BP 109/54  Pulse 87  Temp(Src) 97.6 F (36.4 C) (Oral)  Resp 20  SpO2 100% Physical Exam  Nursing note and vitals reviewed. Constitutional: She appears well-developed and well-nourished. No distress.  Eyes: No scleral icterus.  Neck: Neck supple.  Cardiovascular: Normal rate and regular rhythm.   Pulmonary/Chest: Effort normal and breath sounds normal. She has no wheezes. She has no rales.  Abdominal: Soft. Normal appearance. Bowel sounds are increased. There is tenderness in the epigastric area. There is no rebound, no CVA tenderness, no tenderness at McBurney's point and negative Murphy's sign.  Skin: She is not diaphoretic.    ED Course  Procedures (including critical care time) Labs Review Labs Reviewed  CBC WITH DIFFERENTIAL  BASIC METABOLIC PANEL  LIPASE, BLOOD  HEPATIC FUNCTION PANEL  PREGNANCY, URINE  URINALYSIS, ROUTINE W REFLEX MICROSCOPIC   Imaging Review US Abdomen Complete (Final result)  Result time: 07/23/13 14:27:28    Final result by Rad Results In Interface (07/23/13 14:27:28)    Narrative:   CLINICAL DATA: Abdominal pain.  EXAM: ULTRASOUND ABDOMEN COMPLETE  COMPARISON: 07/30/2010  FINDINGS: Gallbladder:  No gallstones or wall thickening visualized. No sonographic Murphy sign noted.  Common bile duct:  Diameter: 2.0 mm, normal.  Liver:  No focal lesion identified. Within normal limits in parenchymal echogenicity.  IVC:  No abnormality  visualized.  Pancreas:  Normal.  Spleen:  Normal. 7.3 cm in length.  Right Kidney:  Length: 10.7 cm. Echogenicity within normal limits. No mass or hydronephrosis visualized.  Left Kidney:  Length: 10.5 cm. Echogenicity within normal limits. No mass or hydronephrosis visualized.  Abdominal aorta:  Normal. 2.0 cm maximal diameter.  Other findings:  None.  IMPRESSION: Normal exam.   Electronically Signed By: Geanie CooleyJim Maxwell M.D. On: 07/23/2013 14:27    EKG Interpretation   None       MDM   1. Abdominal pain   2. Vomiting    Pt with abdominal pain, mostly epigastric on PE.  Will rule out pancreatitis.  Reports history of gall bladder issues. Las, fluid, medication ordered. Re-eval the patient reports pain has improved and denies nausea.  Abdomen soft non-tender on Exam.  EMR reveals US and HIDA scan in 2012 gall bladder EF 16%, no stone seen on US. CBC, HFP, CBC, UA, Lipase all WNL. UA without infection or other abnormalities. Discussed patient history, condition, and labs with Dr. Rhunette CroftNanavati who agrees the patient can be evaluated as an out-pt. 1315:Re-eval discussed lab results with the patient she reports an increase in pain.  RUQ tender to palpation. Discussed new findings with Dr. Rhunette CroftNanavati, after his assessment of that patient advise consult to surgery and US abdomen.  US negative, General surgery will follow up with the patient as an out-patient this week. Discussed lab results, imaging results, and treatment plan with the patient. Return precautions given. Reports understanding and no other concerns at this time.  Patient is stable for discharge at this time.  Meds given in ED:  Medications  sodium chloride 0.9 % bolus 1,000 mL (0 mLs Intravenous Stopped 07/23/13 1120)  morphine 4 MG/ML injection 4 mg (4 mg Intravenous Given 07/23/13 1014)  ondansetron (ZOFRAN) injection 4 mg (4 mg Intravenous Given 07/23/13 1010)  morphine 4 MG/ML injection 4 mg (4 mg  Intravenous Given 07/23/13 1242)  sodium chloride 0.9 % bolus 1,000 mL (0 mLs Intravenous Stopped 07/23/13 1537)  HYDROmorphone (DILAUDID) injection 1 mg (1 mg Intravenous Given 07/23/13 1425)    Discharge Medication List as of 07/23/2013  3:15 PM    START taking these medications   Details  ondansetron (ZOFRAN ODT) 4 MG disintegrating tablet Take 1 tablet (4 mg total) by mouth every 8 (eight) hours as needed for nausea or vomiting., Starting 07/23/2013, Until Discontinued, Print    oxyCODONE-acetaminophen (PERCOCET/ROXICET) 5-325 MG per tablet Take 1 tablet by mouth every 4 (four) hours as needed for severe pain., Starting 07/23/2013, Until Discontinued, Print    promethazine (PHENERGAN) 25 MG suppository Place 1 suppository (25 mg total) rectally every 6 (six) hours as needed for nausea or vomiting., Starting 07/23/2013, Until Discontinued, Print            Clabe SealLauren M Aashish Hamm, PA-C 07/26/13 1738

## 2013-07-23 NOTE — ED Notes (Signed)
General Surgery at bedside.

## 2013-07-23 NOTE — Discharge Instructions (Signed)
Call for a follow up appointment with a Family or Primary Care Provider.  Follow up with Dr. Avel Peaceodd Rosenbower for further treatment of your abdominal pain. Return if Symptoms worsen.   Take medication as prescribed.

## 2013-07-23 NOTE — ED Notes (Signed)
Mid to rt upper abd pain  Started night before last knows it is her gb

## 2013-07-27 NOTE — ED Provider Notes (Signed)
Medical screening examination/treatment/procedure(s) were performed by non-physician practitioner and as supervising physician I was immediately available for consultation/collaboration.  EKG Interpretation   None        Derwood KaplanAnkit Millan Legan, MD 07/27/13 [phone]

## 2013-08-02 ENCOUNTER — Encounter (INDEPENDENT_AMBULATORY_CARE_PROVIDER_SITE_OTHER): Payer: Self-pay | Admitting: Surgery

## 2013-08-02 ENCOUNTER — Ambulatory Visit (INDEPENDENT_AMBULATORY_CARE_PROVIDER_SITE_OTHER): Payer: 59 | Admitting: Surgery

## 2013-08-02 VITALS — BP 110/68 | HR 80 | Temp 98.3°F | Resp 15 | Ht 63.0 in | Wt 158.2 lb

## 2013-08-02 DIAGNOSIS — K828 Other specified diseases of gallbladder: Secondary | ICD-10-CM

## 2013-08-02 NOTE — Progress Notes (Signed)
Patient ID: Lydia Jones, female   DOB: 29-May-1976, 38 y.o.   MRN: [id number]  Chief Complaint  Patient presents with  . Other    biliary dyskinesia    HPI Lydia Jones is a 38 y.o. female.   HPI This is a pleasant female who is referred by the emergency department for biliary dyskinesia. She has had known dyskinesia for at least 3 years. She has been having intermittent attacks of right upper quadrant abdominal pain. His occurs after fatty meals. She can go many months between attacks. She has had several attacks which were quite severe with sharp pain radiated to the back. She has nausea but no vomiting. She is otherwise without complaints. Past Medical History  Diagnosis Date  . Acid reflux     Past Surgical History  Procedure Laterality Date  . Cesarean section      History reviewed. No pertinent family history.  Social History History  Substance Use Topics  . Smoking status: Never Smoker   . Smokeless tobacco: Never Used  . Alcohol Use: No    Allergies  Allergen Reactions  . Sulfa Antibiotics     Current Outpatient Prescriptions  Medication Sig Dispense Refill  . ondansetron (ZOFRAN ODT) 4 MG disintegrating tablet Take 1 tablet (4 mg total) by mouth every 8 (eight) hours as needed for nausea or vomiting.  10 tablet  0  . oxyCODONE-acetaminophen (PERCOCET/ROXICET) 5-325 MG per tablet Take 1 tablet by mouth every 4 (four) hours as needed for severe pain.  10 tablet  0  . promethazine (PHENERGAN) 25 MG suppository Place 1 suppository (25 mg total) rectally every 6 (six) hours as needed for nausea or vomiting.  12 each  0   No current facility-administered medications for this visit.    Review of Systems Review of Systems  Constitutional: Negative for fever, chills and unexpected weight change.  HENT: Negative for congestion, hearing loss, sore throat, trouble swallowing and voice change.   Eyes: Negative for visual disturbance.  Respiratory: Negative for  cough and wheezing.   Cardiovascular: Negative for chest pain, palpitations and leg swelling.  Gastrointestinal: Positive for nausea, abdominal pain and constipation. Negative for vomiting, diarrhea, blood in stool, abdominal distention and anal bleeding.  Genitourinary: Negative for hematuria, vaginal bleeding and difficulty urinating.  Musculoskeletal: Negative for arthralgias.  Skin: Negative for rash and wound.  Neurological: Negative for seizures, syncope and headaches.  Hematological: Negative for adenopathy. Does not bruise/bleed easily.  Psychiatric/Behavioral: Negative for confusion.    Blood pressure 110/68, pulse 80, temperature 98.3 F (36.8 C), temperature source Oral, resp. rate 15, height 5\' 3"  (1.6 m), weight 158 lb 3.2 oz (71.759 kg).  Physical Exam Physical Exam  Constitutional: She is oriented to person, place, and time. She appears well-developed and well-nourished. No distress.  HENT:  Head: Normocephalic and atraumatic.  Right Ear: External ear normal.  Left Ear: External ear normal.  Nose: Nose normal.  Mouth/Throat: Oropharynx is clear and moist.  Eyes: Conjunctivae are normal. Pupils are equal, round, and reactive to light. Right eye exhibits no discharge. Left eye exhibits no discharge. No scleral icterus.  Neck: Neck supple. No tracheal deviation present.  Cardiovascular: Normal rate, regular rhythm, normal heart sounds and intact distal pulses.   No murmur heard. Pulmonary/Chest: Effort normal and breath sounds normal. No respiratory distress. She has no wheezes. She has no rales.  Abdominal: Soft. Bowel sounds are normal. She exhibits no distension. There is no tenderness. There is no rebound.  Musculoskeletal: Normal range of motion. She exhibits no edema and no tenderness.  Lymphadenopathy:    She has no cervical adenopathy.  Neurological: She is alert and oriented to person, place, and time.  Skin: Skin is warm and dry. She is not diaphoretic. No  erythema.  Psychiatric: Her behavior is normal. Judgment normal.    Data Reviewed Patient has multiple ultrasounds without evidence of gallstones. Her liver function tests were normal. Her gallbladder ejection fraction is 16%  Assessment    Biliary dyskinesia     Plan    I suspect she may also have chronic cholecystitis. Laparoscopic cholecystectomy is recommended. I discussed this with her in detail. I discussed the risks of surgery which includes but is not limited to bleeding, infection, injury, bile leak, injury to other structures, the need to convert to an open procedure, et Karie Sodacetera. She understands and wished to proceed. I also discussed postoperative recovery. Surgery will be scheduled        Toren Tucholski A 08/02/2013, 3:36 PM

## 2013-08-18 ENCOUNTER — Other Ambulatory Visit (INDEPENDENT_AMBULATORY_CARE_PROVIDER_SITE_OTHER): Payer: Self-pay | Admitting: *Deleted

## 2013-08-18 ENCOUNTER — Other Ambulatory Visit (INDEPENDENT_AMBULATORY_CARE_PROVIDER_SITE_OTHER): Payer: Self-pay | Admitting: Surgery

## 2013-08-18 DIAGNOSIS — K811 Chronic cholecystitis: Secondary | ICD-10-CM

## 2013-08-18 MED ORDER — OXYCODONE-ACETAMINOPHEN 5-325 MG PO TABS: 1.0000 | ORAL_TABLET | ORAL | Status: DC | PRN

## 2013-09-10 ENCOUNTER — Encounter (INDEPENDENT_AMBULATORY_CARE_PROVIDER_SITE_OTHER): Payer: Self-pay | Admitting: Surgery

## 2013-09-10 ENCOUNTER — Ambulatory Visit (INDEPENDENT_AMBULATORY_CARE_PROVIDER_SITE_OTHER): Payer: 59 | Admitting: Surgery

## 2013-09-10 VITALS — BP 122/78 | HR 75 | Temp 97.1°F | Resp 16 | Ht 63.0 in | Wt 148.0 lb

## 2013-09-10 DIAGNOSIS — Z09 Encounter for follow-up examination after completed treatment for conditions other than malignant neoplasm: Secondary | ICD-10-CM

## 2013-09-10 NOTE — Progress Notes (Signed)
Subjective:     Patient ID: Lydia Jones, female   DOB: 10/02/1975, 38 y.o.   MRN: [id number]  HPI She is here for her first postop visit status post laparoscopic cholecystectomy. She is doing well and has no complaints.  Review of Systems     Objective:   Physical Exam On exam, her incisions are healing well. The final pathology showed chronic cholecystitis    Assessment:     Patient stable postop     Plan:     She may resume normal activity. I will see her back as needed

## 2016-03-26 ENCOUNTER — Encounter (HOSPITAL_COMMUNITY): Payer: Self-pay | Admitting: Neurology

## 2016-03-26 ENCOUNTER — Observation Stay (HOSPITAL_COMMUNITY)
Admission: EM | Admit: 2016-03-26 | Discharge: 2016-03-27 | Disposition: A | Discharge status: Home / Self Care | Payer: 59 | Attending: Surgery | Admitting: Surgery

## 2016-03-26 ENCOUNTER — Emergency Department (HOSPITAL_COMMUNITY): Payer: 59 | Admitting: Anesthesiology

## 2016-03-26 ENCOUNTER — Emergency Department (HOSPITAL_COMMUNITY): Payer: 59

## 2016-03-26 ENCOUNTER — Encounter (HOSPITAL_COMMUNITY)
Admission: EM | Disposition: A | Discharge status: Home / Self Care | Payer: Self-pay | Source: Home / Self Care | Attending: Emergency Medicine

## 2016-03-26 DIAGNOSIS — R1013 Epigastric pain: Secondary | ICD-10-CM

## 2016-03-26 DIAGNOSIS — K449 Diaphragmatic hernia without obstruction or gangrene: Secondary | ICD-10-CM | POA: Insufficient documentation

## 2016-03-26 DIAGNOSIS — Z23 Encounter for immunization: Secondary | ICD-10-CM | POA: Insufficient documentation

## 2016-03-26 DIAGNOSIS — K219 Gastro-esophageal reflux disease without esophagitis: Secondary | ICD-10-CM | POA: Insufficient documentation

## 2016-03-26 DIAGNOSIS — K353 Acute appendicitis with localized peritonitis: Principal | ICD-10-CM | POA: Insufficient documentation

## 2016-03-26 DIAGNOSIS — Z882 Allergy status to sulfonamides status: Secondary | ICD-10-CM | POA: Diagnosis not present

## 2016-03-26 DIAGNOSIS — R112 Nausea with vomiting, unspecified: Secondary | ICD-10-CM

## 2016-03-26 DIAGNOSIS — K3580 Unspecified acute appendicitis: Secondary | ICD-10-CM | POA: Diagnosis present

## 2016-03-26 HISTORY — PX: LAPAROSCOPIC APPENDECTOMY: SHX408

## 2016-03-26 HISTORY — DX: Adverse effect of unspecified anesthetic, initial encounter: T41.45XA

## 2016-03-26 HISTORY — PX: APPENDECTOMY: SHX54

## 2016-03-26 HISTORY — DX: Other complications of anesthesia, initial encounter: T88.59XA

## 2016-03-26 HISTORY — DX: Nausea with vomiting, unspecified: R11.2

## 2016-03-26 HISTORY — DX: Other specified postprocedural states: Z98.890

## 2016-03-26 LAB — CBC WITH DIFFERENTIAL/PLATELET
Basophils Absolute: 0 10*3/uL (ref 0.0–0.1)
Basophils Relative: 0 %
EOS PCT: 1 %
Eosinophils Absolute: 0.1 10*3/uL (ref 0.0–0.7)
HCT: 39.3 % (ref 36.0–46.0)
Hemoglobin: 13.5 g/dL (ref 12.0–15.0)
LYMPHS ABS: 1.6 10*3/uL (ref 0.7–4.0)
LYMPHS PCT: 15 %
MCH: 32.1 pg (ref 26.0–34.0)
MCHC: 34.4 g/dL (ref 30.0–36.0)
MCV: 93.3 fL (ref 78.0–100.0)
MONO ABS: 0.7 10*3/uL (ref 0.1–1.0)
MONOS PCT: 7 %
NEUTROS ABS: 7.9 10*3/uL — AB (ref 1.7–7.7)
Neutrophils Relative %: 77 %
Platelets: 236 10*3/uL (ref 150–400)
RBC: 4.21 MIL/uL (ref 3.87–5.11)
RDW: 12.3 % (ref 11.5–15.5)
WBC: 10.3 10*3/uL (ref 4.0–10.5)

## 2016-03-26 LAB — COMPREHENSIVE METABOLIC PANEL
ALT: 19 U/L (ref 14–54)
AST: 23 U/L (ref 15–41)
Albumin: 4.3 g/dL (ref 3.5–5.0)
Alkaline Phosphatase: 58 U/L (ref 38–126)
Anion gap: 7 (ref 5–15)
BUN: 6 mg/dL (ref 6–20)
CHLORIDE: 104 mmol/L (ref 101–111)
CO2: 24 mmol/L (ref 22–32)
CREATININE: 0.66 mg/dL (ref 0.44–1.00)
Calcium: 9.2 mg/dL (ref 8.9–10.3)
Glucose, Bld: 92 mg/dL (ref 65–99)
POTASSIUM: 3.2 mmol/L — AB (ref 3.5–5.1)
Sodium: 135 mmol/L (ref 135–145)
TOTAL PROTEIN: 7.3 g/dL (ref 6.5–8.1)
Total Bilirubin: 0.5 mg/dL (ref 0.3–1.2)

## 2016-03-26 LAB — URINALYSIS, ROUTINE W REFLEX MICROSCOPIC
BILIRUBIN URINE: NEGATIVE
GLUCOSE, UA: NEGATIVE mg/dL
HGB URINE DIPSTICK: NEGATIVE
Ketones, ur: 15 mg/dL — AB
Leukocytes, UA: NEGATIVE
Nitrite: NEGATIVE
PH: 7 (ref 5.0–8.0)
Protein, ur: NEGATIVE mg/dL
SPECIFIC GRAVITY, URINE: 1.01 (ref 1.005–1.030)

## 2016-03-26 LAB — LIPASE, BLOOD: LIPASE: 25 U/L (ref 11–51)

## 2016-03-26 LAB — I-STAT BETA HCG BLOOD, ED (MC, WL, AP ONLY): I-stat hCG, quantitative: <5 m[IU]/mL (ref <5)

## 2016-03-26 LAB — I-STAT CG4 LACTIC ACID, ED: Lactic Acid, Venous: 1.05 mmol/L (ref 0.5–1.9)

## 2016-03-26 SURGERY — APPENDECTOMY, LAPAROSCOPIC
Anesthesia: General | Site: Abdomen

## 2016-03-26 MED ORDER — DEXAMETHASONE SODIUM PHOSPHATE 10 MG/ML IJ SOLN
INTRAMUSCULAR | Status: DC | PRN
  Administered 2016-03-26: 10 mg via INTRAVENOUS

## 2016-03-26 MED ORDER — SUGAMMADEX SODIUM 200 MG/2ML IV SOLN
INTRAVENOUS | Status: AC
  Filled 2016-03-26: qty 2

## 2016-03-26 MED ORDER — ONDANSETRON HCL 4 MG/2ML IJ SOLN
4.0000 mg | Freq: Once | INTRAMUSCULAR | Status: AC
  Administered 2016-03-26: 4 mg via INTRAVENOUS
  Filled 2016-03-26: qty 2

## 2016-03-26 MED ORDER — KETOROLAC TROMETHAMINE 30 MG/ML IJ SOLN
INTRAMUSCULAR | Status: AC
  Filled 2016-03-26: qty 1

## 2016-03-26 MED ORDER — ONDANSETRON HCL 4 MG/2ML IJ SOLN
4.0000 mg | Freq: Four times a day (QID) | INTRAMUSCULAR | Status: DC | PRN
  Administered 2016-03-27: 4 mg via INTRAVENOUS
  Filled 2016-03-26: qty 2

## 2016-03-26 MED ORDER — FENTANYL CITRATE (PF) 100 MCG/2ML IJ SOLN
50.0000 ug | Freq: Once | INTRAMUSCULAR | Status: AC
Start: 2016-03-26 — End: 2016-03-26
  Administered 2016-03-26: 50 ug via INTRAVENOUS

## 2016-03-26 MED ORDER — ONDANSETRON HCL 4 MG/2ML IJ SOLN
INTRAMUSCULAR | Status: AC
  Filled 2016-03-26: qty 2

## 2016-03-26 MED ORDER — SUGAMMADEX SODIUM 200 MG/2ML IV SOLN
INTRAVENOUS | Status: DC | PRN
  Administered 2016-03-26: 150 mg via INTRAVENOUS

## 2016-03-26 MED ORDER — DIPHENHYDRAMINE HCL 25 MG PO CAPS: 25.0000 mg | ORAL_CAPSULE | Freq: Four times a day (QID) | ORAL | Status: DC | PRN

## 2016-03-26 MED ORDER — LIDOCAINE 2% (20 MG/ML) 5 ML SYRINGE
INTRAMUSCULAR | Status: AC
  Filled 2016-03-26: qty 5

## 2016-03-26 MED ORDER — GI COCKTAIL ~~LOC~~
30.0000 mL | Freq: Once | ORAL | Status: AC
  Administered 2016-03-26: 30 mL via ORAL
  Filled 2016-03-26: qty 30

## 2016-03-26 MED ORDER — BISACODYL 5 MG PO TBEC: 5.0000 mg | DELAYED_RELEASE_TABLET | Freq: Every day | ORAL | Status: DC | PRN

## 2016-03-26 MED ORDER — HYDROMORPHONE HCL 1 MG/ML IJ SOLN
0.2500 mg | INTRAMUSCULAR | Status: DC | PRN
  Administered 2016-03-26 (×2): 0.5 mg via INTRAVENOUS

## 2016-03-26 MED ORDER — LIDOCAINE 2% (20 MG/ML) 5 ML SYRINGE
INTRAMUSCULAR | Status: DC | PRN
  Administered 2016-03-26: 60 mg via INTRAVENOUS

## 2016-03-26 MED ORDER — SODIUM CHLORIDE 0.9 % IV SOLN
INTRAVENOUS | Status: DC | PRN
  Administered 2016-03-26: 13:00:00 via INTRAVENOUS

## 2016-03-26 MED ORDER — BUPIVACAINE-EPINEPHRINE 0.5% -1:200000 IJ SOLN
INTRAMUSCULAR | Status: DC | PRN
  Administered 2016-03-26: 20 mL

## 2016-03-26 MED ORDER — HYDROMORPHONE HCL 1 MG/ML IJ SOLN
INTRAMUSCULAR | Status: AC
  Filled 2016-03-26: qty 1

## 2016-03-26 MED ORDER — PROPOFOL 10 MG/ML IV BOLUS
INTRAVENOUS | Status: DC | PRN
  Administered 2016-03-26: 150 mg via INTRAVENOUS

## 2016-03-26 MED ORDER — FENTANYL CITRATE (PF) 100 MCG/2ML IJ SOLN
50.0000 ug | Freq: Once | INTRAMUSCULAR | Status: AC
  Administered 2016-03-26: 50 ug via INTRAVENOUS
  Filled 2016-03-26: qty 2

## 2016-03-26 MED ORDER — ACETAMINOPHEN 650 MG RE SUPP: 650.0000 mg | Freq: Four times a day (QID) | RECTAL | Status: DC | PRN

## 2016-03-26 MED ORDER — 0.9 % SODIUM CHLORIDE (POUR BTL) OPTIME
TOPICAL | Status: DC | PRN
  Administered 2016-03-26: 1000 mL

## 2016-03-26 MED ORDER — MIDAZOLAM HCL 2 MG/2ML IJ SOLN
INTRAMUSCULAR | Status: AC
  Filled 2016-03-26: qty 2

## 2016-03-26 MED ORDER — SODIUM CHLORIDE 0.45 % IV SOLN: INTRAVENOUS | Status: DC

## 2016-03-26 MED ORDER — SUCCINYLCHOLINE CHLORIDE 200 MG/10ML IV SOSY
PREFILLED_SYRINGE | INTRAVENOUS | Status: AC
  Filled 2016-03-26: qty 10

## 2016-03-26 MED ORDER — HYDROMORPHONE HCL 1 MG/ML IJ SOLN
1.0000 mg | INTRAMUSCULAR | Status: DC | PRN
  Administered 2016-03-26 – 2016-03-27 (×5): 1 mg via INTRAVENOUS
  Filled 2016-03-26 (×5): qty 1

## 2016-03-26 MED ORDER — PHENYLEPHRINE 40 MCG/ML (10ML) SYRINGE FOR IV PUSH (FOR BLOOD PRESSURE SUPPORT)
PREFILLED_SYRINGE | INTRAVENOUS | Status: AC
  Filled 2016-03-26: qty 10

## 2016-03-26 MED ORDER — FENTANYL CITRATE (PF) 100 MCG/2ML IJ SOLN
INTRAMUSCULAR | Status: AC
  Filled 2016-03-26: qty 4

## 2016-03-26 MED ORDER — LACTATED RINGERS IV SOLN
INTRAVENOUS | Status: DC | PRN
  Administered 2016-03-26: 14:00:00 via INTRAVENOUS

## 2016-03-26 MED ORDER — PROPOFOL 10 MG/ML IV BOLUS
INTRAVENOUS | Status: AC
  Filled 2016-03-26: qty 20

## 2016-03-26 MED ORDER — PROMETHAZINE HCL 25 MG/ML IJ SOLN: 6.2500 mg | INTRAMUSCULAR | Status: DC | PRN

## 2016-03-26 MED ORDER — KETOROLAC TROMETHAMINE 30 MG/ML IJ SOLN
30.0000 mg | Freq: Once | INTRAMUSCULAR | Status: AC | PRN
  Administered 2016-03-26: 30 mg via INTRAVENOUS

## 2016-03-26 MED ORDER — POLYETHYLENE GLYCOL 3350 17 G PO PACK: 17.0000 g | PACK | Freq: Every day | ORAL | Status: DC | PRN

## 2016-03-26 MED ORDER — BUPIVACAINE-EPINEPHRINE (PF) 0.5% -1:200000 IJ SOLN
INTRAMUSCULAR | Status: AC
  Filled 2016-03-26: qty 30

## 2016-03-26 MED ORDER — CEFAZOLIN SODIUM 1 G IJ SOLR
INTRAMUSCULAR | Status: DC | PRN
  Administered 2016-03-26: 2 g via INTRAMUSCULAR

## 2016-03-26 MED ORDER — OXYCODONE HCL 5 MG PO TABS: 5.0000 mg | ORAL_TABLET | ORAL | Status: DC | PRN

## 2016-03-26 MED ORDER — RANITIDINE HCL 150 MG/10ML PO SYRP
150.0000 mg | ORAL_SOLUTION | Freq: Once | ORAL | Status: AC
  Administered 2016-03-26: 150 mg via ORAL
  Filled 2016-03-26: qty 10

## 2016-03-26 MED ORDER — INFLUENZA VAC SPLIT QUAD 0.5 ML IM SUSY
0.5000 mL | PREFILLED_SYRINGE | INTRAMUSCULAR | Status: AC
  Administered 2016-03-27: 0.5 mL via INTRAMUSCULAR
  Filled 2016-03-26: qty 0.5

## 2016-03-26 MED ORDER — ROCURONIUM BROMIDE 10 MG/ML (PF) SYRINGE
PREFILLED_SYRINGE | INTRAVENOUS | Status: DC | PRN
  Administered 2016-03-26: 20 mg via INTRAVENOUS

## 2016-03-26 MED ORDER — DIPHENHYDRAMINE HCL 50 MG/ML IJ SOLN: 25.0000 mg | Freq: Four times a day (QID) | INTRAMUSCULAR | Status: DC | PRN

## 2016-03-26 MED ORDER — SODIUM CHLORIDE 0.9 % IR SOLN
Status: DC | PRN
  Administered 2016-03-26: 1000 mL

## 2016-03-26 MED ORDER — ACETAMINOPHEN 325 MG PO TABS: 650.0000 mg | ORAL_TABLET | Freq: Four times a day (QID) | ORAL | Status: DC | PRN

## 2016-03-26 MED ORDER — CEFAZOLIN SODIUM 1 G IJ SOLR
INTRAMUSCULAR | Status: AC
  Filled 2016-03-26: qty 30

## 2016-03-26 MED ORDER — FENTANYL CITRATE (PF) 100 MCG/2ML IJ SOLN
INTRAMUSCULAR | Status: DC | PRN
  Administered 2016-03-26: 150 ug via INTRAVENOUS

## 2016-03-26 MED ORDER — SODIUM CHLORIDE 0.9 % IV BOLUS (SEPSIS)
1000.0000 mL | Freq: Once | INTRAVENOUS | Status: AC
  Administered 2016-03-26: 1000 mL via INTRAVENOUS

## 2016-03-26 MED ORDER — ROCURONIUM BROMIDE 10 MG/ML (PF) SYRINGE
PREFILLED_SYRINGE | INTRAVENOUS | Status: AC
  Filled 2016-03-26: qty 10

## 2016-03-26 MED ORDER — SUCCINYLCHOLINE CHLORIDE 200 MG/10ML IV SOSY
PREFILLED_SYRINGE | INTRAVENOUS | Status: DC | PRN
  Administered 2016-03-26: 80 mg via INTRAVENOUS

## 2016-03-26 MED ORDER — MIDAZOLAM HCL 5 MG/5ML IJ SOLN
INTRAMUSCULAR | Status: DC | PRN
  Administered 2016-03-26: 2 mg via INTRAVENOUS

## 2016-03-26 MED ORDER — ONDANSETRON 4 MG PO TBDP
4.0000 mg | ORAL_TABLET | Freq: Four times a day (QID) | ORAL | Status: DC | PRN
  Administered 2016-03-27: 4 mg via ORAL
  Filled 2016-03-26: qty 1

## 2016-03-26 MED ORDER — DEXAMETHASONE SODIUM PHOSPHATE 10 MG/ML IJ SOLN
INTRAMUSCULAR | Status: AC
  Filled 2016-03-26: qty 1

## 2016-03-26 MED ORDER — IOPAMIDOL (ISOVUE-300) INJECTION 61%
INTRAVENOUS | Status: AC
  Administered 2016-03-26: 100 mL
  Filled 2016-03-26: qty 100

## 2016-03-26 MED ORDER — HYDROMORPHONE HCL 1 MG/ML IJ SOLN
1.0000 mg | Freq: Once | INTRAMUSCULAR | Status: AC
  Administered 2016-03-26: 1 mg via INTRAVENOUS
  Filled 2016-03-26: qty 1

## 2016-03-26 MED ORDER — ENOXAPARIN SODIUM 40 MG/0.4ML ~~LOC~~ SOLN
40.0000 mg | SUBCUTANEOUS | Status: DC
  Administered 2016-03-27: 40 mg via SUBCUTANEOUS
  Filled 2016-03-26: qty 0.4

## 2016-03-26 SURGICAL SUPPLY — 48 items
ADH SKN CLS APL DERMABOND .7 (GAUZE/BANDAGES/DRESSINGS) ×1
APPLIER CLIP 5 13 M/L LIGAMAX5 (MISCELLANEOUS)
APPLIER CLIP ROT 10 11.4 M/L (STAPLE)
APR CLP MED LRG 11.4X10 (STAPLE)
APR CLP MED LRG 5 ANG JAW (MISCELLANEOUS)
BAG SPEC RTRVL LRG 6X4 10 (ENDOMECHANICALS) ×1
CANISTER SUCTION 2500CC (MISCELLANEOUS) ×2 IMPLANT
CHLORAPREP W/TINT 26ML (MISCELLANEOUS) ×2 IMPLANT
CLIP APPLIE 5 13 M/L LIGAMAX5 (MISCELLANEOUS) IMPLANT
CLIP APPLIE ROT 10 11.4 M/L (STAPLE) IMPLANT
COVER SURGICAL LIGHT HANDLE (MISCELLANEOUS) ×2 IMPLANT
CUTTER FLEX LINEAR 45M (STAPLE) ×1 IMPLANT
DERMABOND ADVANCED (GAUZE/BANDAGES/DRESSINGS) ×1
DERMABOND ADVANCED .7 DNX12 (GAUZE/BANDAGES/DRESSINGS) IMPLANT
ELECT REM PT RETURN 9FT ADLT (ELECTROSURGICAL) ×2
ELECTRODE REM PT RTRN 9FT ADLT (ELECTROSURGICAL) ×1 IMPLANT
GLOVE BIOGEL PI IND STRL 6.5 (GLOVE) IMPLANT
GLOVE BIOGEL PI IND STRL 7.0 (GLOVE) IMPLANT
GLOVE BIOGEL PI INDICATOR 6.5 (GLOVE) ×1
GLOVE BIOGEL PI INDICATOR 7.0 (GLOVE) ×1
GLOVE SURG SIGNA 7.5 PF LTX (GLOVE) ×2 IMPLANT
GLOVE SURG SS PI 6.0 STRL IVOR (GLOVE) ×1 IMPLANT
GLOVE SURG SS PI 6.5 STRL IVOR (GLOVE) ×1 IMPLANT
GOWN STRL REUS W/ TWL LRG LVL3 (GOWN DISPOSABLE) ×2 IMPLANT
GOWN STRL REUS W/ TWL XL LVL3 (GOWN DISPOSABLE) ×1 IMPLANT
GOWN STRL REUS W/TWL LRG LVL3 (GOWN DISPOSABLE) ×4
GOWN STRL REUS W/TWL XL LVL3 (GOWN DISPOSABLE) ×2
KIT BASIN OR (CUSTOM PROCEDURE TRAY) ×2 IMPLANT
KIT ROOM TURNOVER OR (KITS) ×2 IMPLANT
LIQUID BAND (GAUZE/BANDAGES/DRESSINGS) ×2 IMPLANT
NS IRRIG 1000ML POUR BTL (IV SOLUTION) ×2 IMPLANT
PAD ARMBOARD 7.5X6 YLW CONV (MISCELLANEOUS) ×4 IMPLANT
POUCH SPECIMEN RETRIEVAL 10MM (ENDOMECHANICALS) ×2 IMPLANT
RELOAD 45 VASCULAR/THIN (ENDOMECHANICALS) IMPLANT
RELOAD STAPLE 45 2.5 WHT GRN (ENDOMECHANICALS) IMPLANT
RELOAD STAPLE 45 3.5 BLU ETS (ENDOMECHANICALS) IMPLANT
RELOAD STAPLE TA45 3.5 REG BLU (ENDOMECHANICALS) ×2 IMPLANT
SET IRRIG TUBING LAPAROSCOPIC (IRRIGATION / IRRIGATOR) ×2 IMPLANT
SHEARS HARMONIC HDI 36CM (ELECTROSURGICAL) ×1 IMPLANT
SLEEVE ENDOPATH XCEL 5M (ENDOMECHANICALS) ×2 IMPLANT
SPECIMEN JAR SMALL (MISCELLANEOUS) ×2 IMPLANT
SUT MON AB 4-0 PC3 18 (SUTURE) ×2 IMPLANT
TOWEL OR 17X24 6PK STRL BLUE (TOWEL DISPOSABLE) ×2 IMPLANT
TOWEL OR 17X26 10 PK STRL BLUE (TOWEL DISPOSABLE) ×2 IMPLANT
TRAY LAPAROSCOPIC MC (CUSTOM PROCEDURE TRAY) ×2 IMPLANT
TROCAR XCEL BLUNT TIP 100MML (ENDOMECHANICALS) ×2 IMPLANT
TROCAR XCEL NON-BLD 5MMX100MML (ENDOMECHANICALS) ×2 IMPLANT
TUBING INSUFFLATION (TUBING) ×2 IMPLANT

## 2016-03-26 NOTE — ED Notes (Signed)
Gave patient cup of ice water and instructed her to drink as much as she could tolerate. Patient in obvious distress.

## 2016-03-26 NOTE — Transfer of Care (Signed)
Immediate Anesthesia Transfer of Care Note  Patient: Lydia Jones  Procedure(s) Performed: Procedure(s): APPENDECTOMY LAPAROSCOPIC (N/A)  Patient Location: PACU  Anesthesia Type:General  Level of Consciousness: awake, oriented and patient cooperative  Airway & Oxygen Therapy: Patient Spontanous Breathing  Post-op Assessment: Report given to RN, Post -op Vital signs reviewed and stable and Patient moving all extremities  Post vital signs: Reviewed and stable  Last Vitals:  Vitals:   03/26/16 1030 03/26/16 1352  BP: (!) 105/54 121/60  Pulse: 93   Resp:  20  Temp:  36.1 C    Last Pain:  Vitals:   03/26/16 1352  TempSrc:   PainSc: Asleep         Complications: No apparent anesthesia complications

## 2016-03-26 NOTE — H&P (Signed)
Lydia Jones  Lydia Jones 08/04/1975  [id number].    Requesting MD: Voncille Lo Chief Complaint/Reason for Consult: Abdominal pain  HPI:  Lydia Jones is a 40yo female who presented to [facility] earlier today with acute onset upper quadrant epigastric and right-sided abdominal pain that began around 6AM today. Describes the pain as sharp and stabbing. It is constant and worse with movement. She also reports nausea and vomiting. Denies diarrhea, constipation, fevers, chills, or dysuria. Last meal last night Last BM yesterday  ED workup: - CT: Evidence of early appendicitis, as above. No abscess or evidence of perforation. - WBC 10.3, afebrile  PMH significant for GERD Abdominal surgical history cholecystectomy 2015 Dr. Ninfa Linden, c. 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  Past Medical History:  Diagnosis Date  . Acid reflux     Past Surgical History:  Procedure Laterality Date  . CESAREAN SECTION    . CHOLECYSTECTOMY      Social History:  reports that she has never smoked. She has never used smokeless tobacco. She reports that she does not drink alcohol or use drugs.  Allergies:  Allergies  Allergen Reactions  . Sulfa Antibiotics      (Not in a hospital admission)  Blood pressure (!) 108/53, pulse 64, temperature 97.7 F (36.5 C), temperature source Oral, resp. rate 16, last menstrual period 03/15/2016, SpO2 100 %. Physical Exam: General: pleasant, WD/WN white female who is laying uncomfortably in bed HEENT: head is normocephalic, atraumatic.  Mouth is pink and moist Heart: regular, rate, and rhythm.  No obvious murmurs, gallops, or rubs noted.  Palpable pedal pulses bilaterally Lungs: CTAB, no wheezes, rhonchi, or rales noted.  Respiratory effort nonlabored Abd: well healed  lower transverse and lap incisions, soft, ND, +BS, no masses, hernias, or organomegaly. Tender RLQ and epigastric MS: all 4 extremities are symmetrical with no cyanosis, clubbing, or edema. Skin: warm and dry with no masses, lesions, or rashes Psych: A&Ox3 with an appropriate affect.  Results for orders placed or performed during the hospital encounter of 03/26/16 (from the past 48 hour(s))  Comprehensive metabolic panel     Status: Abnormal   Collection Time: 03/26/16  7:57 AM  Result Value Ref Range   Sodium 135 135 - 145 mmol/L   Potassium 3.2 (L) 3.5 - 5.1 mmol/L   Chloride 104 101 - 111 mmol/L   CO2 24 22 - 32 mmol/L   Glucose, Bld 92 65 - 99 mg/dL   BUN 6 6 - 20 mg/dL   Creatinine, Ser 0.66 0.44 - 1.00 mg/dL   Calcium 9.2 8.9 - 10.3 mg/dL   Total Protein 7.3 6.5 - 8.1 g/dL   Albumin 4.3 3.5 - 5.0 g/dL   AST 23 15 - 41 U/L   ALT 19 14 - 54 U/L   Alkaline Phosphatase 58 38 - 126 U/L   Total Bilirubin 0.5 0.3 - 1.2 mg/dL   GFR calc non Af Amer >60 >60 mL/min   GFR calc Af Amer >60 >60 mL/min    Comment: (Jones) The eGFR has been calculated using the CKD EPI equation. This calculation has not been validated in all clinical situations. eGFR's persistently <60 mL/min signify possible Chronic Kidney Disease.    Anion gap 7 5 - 15  Lipase, blood     Status: None  Collection Time: 03/26/16  7:57 AM  Result Value Ref Range   Lipase 25 11 - 51 U/L  CBC with Differential     Status: Abnormal   Collection Time: 03/26/16  7:57 AM  Result Value Ref Range   WBC 10.3 4.0 - 10.5 K/uL   RBC 4.21 3.87 - 5.11 MIL/uL   Hemoglobin 13.5 12.0 - 15.0 g/dL   HCT 39.3 36.0 - 46.0 %   MCV 93.3 78.0 - 100.0 fL   MCH 32.1 26.0 - 34.0 pg   MCHC 34.4 30.0 - 36.0 g/dL   RDW 12.3 11.5 - 15.5 %   Platelets 236 150 - 400 K/uL   Neutrophils Relative % 77 %   Neutro Abs 7.9 (H) 1.7 - 7.7 K/uL   Lymphocytes Relative 15 %   Lymphs Abs 1.6 0.7 - 4.0 K/uL   Monocytes Relative 7 %   Monocytes Absolute  0.7 0.1 - 1.0 K/uL   Eosinophils Relative 1 %   Eosinophils Absolute 0.1 0.0 - 0.7 K/uL   Basophils Relative 0 %   Basophils Absolute 0.0 0.0 - 0.1 K/uL  I-Stat beta hCG blood, ED     Status: None   Collection Time: 03/26/16  8:02 AM  Result Value Ref Range   I-stat hCG, quantitative <5.0 <5 mIU/mL   Comment 3            Comment:   GEST. AGE      CONC.  (mIU/mL)   <=1 WEEK        5 - 50     2 WEEKS       50 - 500     3 WEEKS       100 - 10,000     4 WEEKS     1,000 - 30,000        FEMALE AND NON-PREGNANT FEMALE:     LESS THAN 5 mIU/mL   I-Stat CG4 Lactic Acid, ED     Status: None   Collection Time: 03/26/16  8:05 AM  Result Value Ref Range   Lactic Acid, Venous 1.05 0.5 - 1.9 mmol/L   Ct Abdomen Pelvis W Contrast  Result Date: 03/26/2016 CLINICAL DATA:  40 year old female with a history of left upper quadrant pain EXAM: CT ABDOMEN AND PELVIS WITH CONTRAST TECHNIQUE: Multidetector CT imaging of the abdomen and pelvis was performed using the standard protocol following bolus administration of intravenous contrast. CONTRAST:  135m ISOVUE-300 IOPAMIDOL (ISOVUE-300) INJECTION 61% COMPARISON:  No prior CT FINDINGS: Lower chest: No acute abnormality. Hepatobiliary: No focal liver abnormality is seen. Status post cholecystectomy. No biliary dilatation. Pancreas: Unremarkable. No pancreatic ductal dilatation or surrounding inflammatory changes. Spleen: Normal in size without focal abnormality. Adrenals/Urinary Tract: Adrenal glands are unremarkable. Kidneys are normal, without renal calculi, focal lesion, or hydronephrosis. Bladder is unremarkable. Stomach/Bowel: Unremarkable appearance of stomach. Small hiatal hernia. No abnormally distended small bowel. No transition point. Unremarkable appearance of colon. Early appendicitis with distal dilation of the appendix measuring 8 mm-9 mm. Appendicolith within the base the appendix. Vascular/Lymphatic: No aneurysm, no significant vascular calcifications.  No dissection. Reproductive: Uterus and bilateral adnexa are unremarkable. Other: No abdominal wall hernia or abnormality. No abdominopelvic ascites. Musculoskeletal: No displaced fracture. Degenerative changes of L5-S1. No bony canal narrowing. IMPRESSION: Evidence of early appendicitis, as above. No abscess or evidence of perforation. These results were called by telephone at the time of interpretation on 03/26/2016 at 9:34 am to Dr. KLaneta Simmers who verbally acknowledged these results. Signed,  Dulcy Fanny. Earleen Newport, DO [facility] Electronically Signed   By: Corrie Mckusick D.O.   On: 03/26/2016 09:36      Assessment/Plan Acute appendicitis - Admit to med-surg - NPO, IVF, pain control, antiemetics, antibiotics (rocephin, flagyl) - OR later today  ID - rocephin, flagyl VTE - SCDs FEN - NPO, IVF  Plan - plan OR later today for laparoscopic appendectomy  Jerrye Beavers, [facility] 03/26/2016, 10:01 AM Pager: [phone] Consults: [phone] Mon-Fri 7:00 am-4:30 pm Sat-Sun 7:00 am-11:30 am

## 2016-03-26 NOTE — ED Notes (Signed)
Pt's husband took her belongings with him (purse, phone, clothing)

## 2016-03-26 NOTE — ED Triage Notes (Signed)
Pt reports upper abd pain, n/v/d since 0600 this morning.

## 2016-03-26 NOTE — Op Note (Signed)
Appendectomy, Lap, Procedure Note  Indications: The patient presented with a history of right-sided abdominal pain. A CT revealed findings consistent with acute appendicitis.  Pre-operative Diagnosis: acute appendicitis  Post-operative Diagnosis: Same  Surgeon: Abigail MiyamotoBLACKMAN,Dennis Killilea A   Assistants: 0  Anesthesia: General endotracheal anesthesia  ASA Class: 1  Procedure Details  The patient was seen again in the Holding Room. The risks, benefits, complications, treatment options, and expected outcomes were discussed with the patient and/or family. The possibilities of reaction to medication, perforation of viscus, bleeding, recurrent infection, finding a normal appendix, the need for additional procedures, failure to diagnose a condition, and creating a complication requiring transfusion or operation were discussed. There was concurrence with the proposed plan and informed consent was obtained. The site of surgery was properly noted. The patient was taken to Operating Room, identified as Lydia Jones and the procedure verified as Appendectomy. A Time Out was held and the above information confirmed.  The patient was placed in the supine position and general anesthesia was induced, along with placement of orogastric tube, Venodyne boots, and a Foley catheter. The abdomen was prepped and draped in a sterile fashion. A one centimeter infraumbilical incision was made.  The midline fascia was incised with a #15 blade.  A Kelly clamp was used to confirm entrance into the peritoneal cavity.  A pursestring suture was passed around the incision with a 0 Vicryl.  The Hasson was introduced into the abdomen and the tails of the suture were used to hold the Hasson in place.   The pneumoperitoneum was then established to steady pressure of 15 mmHg.  Additional 5 mm cannulas then placed in the left lower quadrant of the abdomen and right upper quadrant under direct visualization. A careful evaluation of the  entire abdomen was carried out. The patient was placed in Trendelenburg and left lateral decubitus position. The small intestines were retracted in the cephalad and left lateral direction away from the pelvis and right lower quadrant. The patient was found to have an enlarged and inflamed appendix that was extending into the pelvis. There was no evidence of perforation.  The appendix was carefully dissected. The appendix was was skeletonized with the harmonic scalpel.   The appendix was divided at its base using an endo-GIA stapler. Minimal appendiceal stump was left in place. There was no evidence of bleeding, leakage, or complication after division of the appendix. Irrigation was also performed and irrigate suctioned from the abdomen as well.  The umbilical port site was closed with the purse string suture. There was no residual palpable fascial defect.  The trocar site skin wounds were closed with 4-0 Monocryl.  Instrument, sponge, and needle counts were correct at the conclusion of the case.   Findings: The appendix was found to be inflamed. There were not signs of necrosis.  There was not perforation. There was not abscess formation.  Estimated Blood Loss:  Minimal         Drains:none         Complications:  None; patient tolerated the procedure well.         Disposition: PACU - hemodynamically stable.         Condition: stable

## 2016-03-26 NOTE — ED Provider Notes (Signed)
[facility] DEPT Provider Note  CSN: [id number] Arrival Date & Time: 03/26/16 @ 47  History    Chief Complaint Chief Complaint  Patient presents with  . Abdominal Pain    HPI Lydia Jones is a 40 y.o. female.  Patient presents To the emergency department for assessment of upper quadrant epigastric and right-sided abdominal pain that started suddenly at 6 AM this morning. Patient endorses is a sharp stabbing that stays localized to upper quadrants. Patient endorses no right lower quadrant pain no pelvic pain denies urinary symptoms and denies vaginal bleeding or vaginal discharge. Patient endorses that she has had a gallbladder removal for biliary dyskinesia. Patient endorses no sick contacts and states she has not had suspicious food intake. Patient endorses she has had dry heaving and it is not productive emesis. No evidence of blood per oral secretions. Patient endorses no diarrhea no constipation and is passing flatus at this time. Patient denies headache no chest pain and no shortness of breath. Denies fevers or chills.  Past Medical & Surgical History    Past Medical History:  Diagnosis Date  . Acid reflux    Patient Active Problem List   Diagnosis Date Noted  . Biliary dyskinesia 08/02/2013   Past Surgical History:  Procedure Laterality Date  . CESAREAN SECTION    . CHOLECYSTECTOMY      Family & Social History    No family history on file. Social History  Substance Use Topics  . Smoking status: Never Smoker  . Smokeless tobacco: Never Used  . Alcohol use No    Home Medications    Prior to Admission medications   Not on File    Allergies    Sulfa antibiotics  I reviewed & agree with nursing's documentation on the patient's past medical, surgical, social & family histories as well as their allergies.  Review of Systems  Complete ROS obtained, and is negative except as stated in HPI.  Physical Exam  Updated Vital Signs BP 106/84   Pulse 81    Temp 97.7 F (36.5 C) (Oral)   Resp 16   LMP 03/15/2016   SpO2 100%  I have reviewed the triage vital signs and the nursing notes. Physical Exam CONST: Patient in no apparent distress, well hydrated, in mild to moderate distress, crying.  EYES: PERRLA. EOMI. Conjunctiva w/o d/c. Lids AT w/o swelling.  ENMT: External Nares & Ears AT w/o swelling. Oropharynx patent. MM moist.  NECK: ROM full w/o rigidity. Trachea midline. JVD absent.  CVS: +S1/S2 w/o obvious murmur. Lower extremities w/o pitting edema.  RESP: Respiratory effort unlabored w/o retractions & accessory muscle use. BS clear bilaterally.  GI: Soft & ND. +BS x 4. TTP present in epigastrium and RUQ. Hernia absent. Guarding & Rebound absent. Surgical incisions well healed. No ecchymosis at umbilicus or flanks.  BACK: CVA TTP absent bilaterally.  SKIN: Skin warm & dry. Turgor good. No rash.  PSYCH: Alert. Oriented. Affect and mood appropriate.  NEURO: CN II-XII grossly intact. Motor exam symmetric w/ upper & lower extremities 5/5 bilaterally. Sensation grossly intact.  MSK: Joints located & stable, w/o obvious dislocation & obvious deformity or crepitus absent w/ Cap refill < 2 sec. Peripheral pulses 2+ & equal in all extremities.   ED Treatments & Results   Labs (only abnormal results are displayed) Labs Reviewed  COMPREHENSIVE METABOLIC PANEL - Abnormal; Notable for the following:       Result Value   Potassium 3.2 (*)    All other  components within normal limits  URINE CULTURE  LIPASE, BLOOD  URINALYSIS, ROUTINE W REFLEX MICROSCOPIC (NOT AT [facility])  I-STAT BETA HCG BLOOD, ED ([facility], [facility], [facility] ONLY)  I-STAT CG4 LACTIC ACID, ED    EKG    EKG Interpretation  Date/Time:    Ventricular Rate:    PR Interval:    QRS Duration:   QT Interval:    QTC Calculation:   R Axis:     Text Interpretation:         Radiology No results found.  Pertinent labs & imaging results that were available during my care of the patient were  independently visualized by me and considered in my medical decision making, please see chart for details.  Procedures (including critical care time) Procedures  Medications Ordered in ED Medications  sodium chloride 0.9 % bolus 1,000 mL (1,000 mLs Intravenous New Bag/Given 03/26/16 0803)  ranitidine (ZANTAC) 150 MG/10ML syrup 150 mg (not administered)  ondansetron (ZOFRAN) injection 4 mg (4 mg Intravenous Given 03/26/16 0803)  fentaNYL (SUBLIMAZE) injection 50 mcg (50 mcg Intravenous Given 03/26/16 0804)  fentaNYL (SUBLIMAZE) injection 50 mcg (50 mcg Intravenous Given 03/26/16 0828)  gi cocktail (Maalox,Lidocaine,Donnatal) (30 mLs Oral Given 03/26/16 7425)    Initial Impression & Plan / ED Course & Results / Final Disposition   Initial Impression & Plan Patient presents emergency Department for assessment of epigastric and right upper quadrant pain of acute onset at 6 AM this morning. Patient has history of gallbladder removal with laparoscopic cholecystectomy status post biliary dyskinesia and endorses surgical history also includes cesarean section. Patient denies fevers or chills and is afebrile. Patient's vital signs are unremarkable and patient has no criteria met for sepsis at this time. Patient does not have signs of peritonitis therefore antibiotics and analgesia will be provided and serial abdominal exams performed. Should patient have continued abdominal pain and significant nausea CT of abdomen and pelvis with contrast will be obtained. I obtained urinary studies along with screening laboratory work and other abdominal labs.  ED Course & Results 07/23/2013 patient had no acute findings. Upon my review of patient's pregnancy tests patient is negative. CMP unremarkable for LFT abnormalities and only remarkable for hypokalemia which is consistent with patient's decreased by mouth intake. Lipase is unremarkable at 25. CBC unremarkable. Patient's lactic acid is within normal limits.  Due to  patient's history of reflux GI cocktail along with Zantac by mouth was provided to the patient which resulted in no resolution of abdominal pain therefore I obtained CT abd pelvis w/ contrast which upon my review I appreciate early findings of appendicitis.  I therefore consulted the Surgery Service for assistance in the patient's evaluation & care. We discussed the patient's H&P & ED course along w/ pertinent imaging abdominal results. Based upon that discussion, they have agreed to evaluate the patient. Please see their documentation for their full evaluation & recommendations. After evaluating the patient, they request patient be prepared for the OR and admitted to their service. Through discussion with Surgery will hold antibiotics and will be deferred to Surgery discretion and they are in agreement. Patient updated upon care plan and transported in stable condition.  Final Clinical Impression & ED Diagnoses   1. Epigastric pain   2. Non-intractable vomiting with nausea, unspecified vomiting type    Patient care discussed with the attending physician, Dr. Laneta Simmers, who oversaw their evaluation & treatment & voiced agreement.  Note: This document was prepared using Systems analyst and may include  unintentional dictation errors.  House Officer: Voncille Lo, MD, Emergency Medicine Resident.   Voncille Lo, MD 03/26/16 1057    Leo Grosser, MD 03/26/16 2224

## 2016-03-26 NOTE — Anesthesia Procedure Notes (Signed)
Procedure Name: Intubation Date/Time: 03/26/2016 1:08 PM Performed by: Charm BargesBUTLER, Lyann Hagstrom R Pre-anesthesia Checklist: Patient identified, Emergency Drugs available, Suction available and Patient being monitored Patient Re-evaluated:Patient Re-evaluated prior to inductionOxygen Delivery Method: Circle System Utilized Preoxygenation: Pre-oxygenation with 100% oxygen Intubation Type: IV induction Ventilation: Mask ventilation without difficulty Laryngoscope Size: Mac and 3 Grade View: Grade II Tube type: Oral Tube size: 7.5 mm Number of attempts: 1 Airway Equipment and Method: Stylet Placement Confirmation: ETT inserted through vocal cords under direct vision,  positive ETCO2 and breath sounds checked- equal and bilateral Secured at: 21 cm Tube secured with: Tape Dental Injury: Teeth and Oropharynx as per pre-operative assessment

## 2016-03-26 NOTE — Anesthesia Preprocedure Evaluation (Signed)
Anesthesia Evaluation  Patient identified by MRN, date of birth, ID band Patient awake    Reviewed: Allergy & Precautions, H&P , NPO status , Patient's Chart, lab work & pertinent test results, reviewed documented beta blocker date and time   Airway Mallampati: II  TM Distance: >3 FB Neck ROM: full    Dental no notable dental hx.    Pulmonary neg pulmonary ROS,    Pulmonary exam normal breath sounds clear to auscultation       Cardiovascular Exercise Tolerance: Good negative cardio ROS Normal cardiovascular exam Rhythm:regular Rate:Normal     Neuro/Psych negative neurological ROS  negative psych ROS   GI/Hepatic Neg liver ROS, GERD  ,  Endo/Other  negative endocrine ROS  Renal/GU negative Renal ROS  negative genitourinary   Musculoskeletal   Abdominal   Peds  Hematology negative hematology ROS (+)   Anesthesia Other Findings   Reproductive/Obstetrics negative OB ROS                             Anesthesia Physical Anesthesia Plan  ASA: II  Anesthesia Plan: General   Post-op Pain Management:    Induction: Intravenous  Airway Management Planned: Oral ETT  Additional Equipment:   Intra-op Plan:   Post-operative Plan: Extubation in OR  Informed Consent: I have reviewed the patients History and Physical, chart, labs and discussed the procedure including the risks, benefits and alternatives for the proposed anesthesia with the patient or authorized representative who has indicated his/her understanding and acceptance.   Dental Advisory Given  Plan Discussed with: CRNA and Surgeon  Anesthesia Plan Comments:         Anesthesia Quick Evaluation

## 2016-03-26 NOTE — ED Notes (Signed)
Report given to Olegario MessierKathy, Charity fundraiserN. Can go to bay # [phone].

## 2016-03-26 NOTE — Anesthesia Postprocedure Evaluation (Signed)
Anesthesia Post Note  Patient: Lydia Jones  Procedure(Jones) Performed: Procedure(Jones) (LRB): APPENDECTOMY LAPAROSCOPIC (N/A)  Patient location during evaluation: PACU Anesthesia Type: General Level of consciousness: awake and alert Pain management: pain level controlled Vital Signs Assessment: post-procedure vital signs reviewed and stable Respiratory status: spontaneous breathing, nonlabored ventilation, respiratory function stable and patient connected to nasal cannula oxygen Cardiovascular status: blood pressure returned to baseline and stable Postop Assessment: no signs of nausea or vomiting Anesthetic complications: no    Last Vitals:  Vitals:   03/26/16 1030 03/26/16 1352  BP: (!) 105/54 121/60  Pulse: 93   Resp:  20  Temp:  36.1 C    Last Pain:  Vitals:   03/26/16 1405  TempSrc:   PainSc: Asleep                 Lydia Jones

## 2016-03-27 ENCOUNTER — Encounter (INDEPENDENT_AMBULATORY_CARE_PROVIDER_SITE_OTHER): Payer: Self-pay | Admitting: Physician Assistant

## 2016-03-27 ENCOUNTER — Encounter (HOSPITAL_COMMUNITY): Payer: Self-pay | Admitting: Surgery

## 2016-03-27 LAB — URINE CULTURE

## 2016-03-27 MED ORDER — ALUM & MAG HYDROXIDE-SIMETH 200-200-20 MG/5ML PO SUSP
30.0000 mL | ORAL | Status: DC | PRN
  Administered 2016-03-27 (×3): 30 mL via ORAL
  Filled 2016-03-27 (×3): qty 30

## 2016-03-27 MED ORDER — OXYCODONE HCL 5 MG PO TABS: 5.0000 mg | ORAL_TABLET | ORAL | Status: DC | PRN

## 2016-03-27 MED ORDER — ONDANSETRON 4 MG PO TBDP: 4.0000 mg | ORAL_TABLET | Freq: Four times a day (QID) | ORAL | 0 refills | Status: DC | PRN

## 2016-03-27 MED ORDER — OXYCODONE HCL 5 MG PO TABS: 5.0000 mg | ORAL_TABLET | ORAL | 0 refills | Status: DC | PRN

## 2016-03-27 MED ORDER — HYDROMORPHONE HCL 1 MG/ML IJ SOLN: 1.0000 mg | INTRAMUSCULAR | Status: DC | PRN

## 2016-03-27 NOTE — Progress Notes (Signed)
Patient ID: Lydia Jones, female   DOB: 12/22/1975, 40 y.o.   MRN: [id number]  [facility]  1 Day Post-Op  Subjective: Feeling nauseated this morning, poor appetite. Passing flatus, no BM. Ambulated halls this morning with no problems.  Objective: Vital signs in last 24 hours: Temp:  [97 F (36.1 C)-98.8 F (37.1 C)] 98.5 F (36.9 C) (10/04 0509) Pulse Rate:  [54-103] 77 (10/04 0509) Resp:  [14-23] 18 (10/04 0509) BP: (93-121)/(45-98) 100/52 (10/04 0509) SpO2:  [94 %-99 %] 97 % (10/04 0509) Last BM Date: 03/25/16  Intake/Output from previous day: 10/03 0701 - 10/04 0700 In: 2100 [I.V.:1100; IV Piggyback:1000] Out: 620 [Urine:600; Blood:20] Intake/Output this shift: Total I/O In: -  Out: 400 [Urine:400]  PE: Gen:  Alert, NAD, pleasant Card:  RRR, no M/G/R heard Pulm:  CTA, no W/R/R Abd: Soft, ND, +BS, appropriately tender, incisions C/D/I  Lab Results:   Recent Labs  03/26/16 0757  WBC 10.3  HGB 13.5  HCT 39.3  PLT 236   BMET  Recent Labs  03/26/16 0757  NA 135  K 3.2*  CL 104  CO2 24  GLUCOSE 92  BUN 6  CREATININE 0.66  CALCIUM 9.2   PT/INR No results for input(s): LABPROT, INR in the last 72 hours. CMP     Component Value Date/Time   NA 135 03/26/2016 0757   K 3.2 (L) 03/26/2016 0757   CL 104 03/26/2016 0757   CO2 24 03/26/2016 0757   GLUCOSE 92 03/26/2016 0757   BUN 6 03/26/2016 0757   CREATININE 0.66 03/26/2016 0757   CALCIUM 9.2 03/26/2016 0757   PROT 7.3 03/26/2016 0757   ALBUMIN 4.3 03/26/2016 0757   AST 23 03/26/2016 0757   ALT 19 03/26/2016 0757   ALKPHOS 58 03/26/2016 0757   BILITOT 0.5 03/26/2016 0757   GFRNONAA >60 03/26/2016 0757   GFRAA >60 03/26/2016 0757   Lipase     Component Value Date/Time   LIPASE 25 03/26/2016 0757       Studies/Results: Ct Abdomen Pelvis W Contrast  Result Date: 03/26/2016 CLINICAL DATA:  40 year old female with a history of left upper quadrant pain EXAM: CT  ABDOMEN AND PELVIS WITH CONTRAST TECHNIQUE: Multidetector CT imaging of the abdomen and pelvis was performed using the standard protocol following bolus administration of intravenous contrast. CONTRAST:  100mL ISOVUE-300 IOPAMIDOL (ISOVUE-300) INJECTION 61% COMPARISON:  No prior CT FINDINGS: Lower chest: No acute abnormality. Hepatobiliary: No focal liver abnormality is seen. Status post cholecystectomy. No biliary dilatation. Pancreas: Unremarkable. No pancreatic ductal dilatation or surrounding inflammatory changes. Spleen: Normal in size without focal abnormality. Adrenals/Urinary Tract: Adrenal glands are unremarkable. Kidneys are normal, without renal calculi, focal lesion, or hydronephrosis. Bladder is unremarkable. Stomach/Bowel: Unremarkable appearance of stomach. Small hiatal hernia. No abnormally distended small bowel. No transition point. Unremarkable appearance of colon. Early appendicitis with distal dilation of the appendix measuring 8 mm-9 mm. Appendicolith within the base the appendix. Vascular/Lymphatic: No aneurysm, no significant vascular calcifications. No dissection. Reproductive: Uterus and bilateral adnexa are unremarkable. Other: No abdominal wall hernia or abnormality. No abdominopelvic ascites. Musculoskeletal: No displaced fracture. Degenerative changes of L5-S1. No bony canal narrowing. IMPRESSION: Evidence of early appendicitis, as above. No abscess or evidence of perforation. These results were called by telephone at the time of interpretation on 03/26/2016 at 9:34 am to Dr. Clydene PughKnott, who verbally acknowledged these results. Signed, Yvone NeuJaime S. Loreta AveWagner, DO [facility] Electronically Signed  By: Gilmer Mor D.O.   On: 03/26/2016 09:36    Anti-infectives: Anti-infectives    None       Assessment/Plan S/p laparoscopic appendectomy 03/26/16 Dr. Magnus Ivan - POD 1  - nauseated and not eating very much  ID - rocephin/flagyl day  2 VTE - SCDs, lovenox FEN - vegetarian, IVF  Plan - work on pain/nausea control. Continue to work on diet and ambulation. Will recheck later this afternoon for possible discharge.   LOS: 0 days    Edson Snowball , [facility] 03/27/2016, 9:36 AM Pager: [phone] Consults: [phone] Mon-Fri 7:00 am-4:30 pm Sat-Sun 7:00 am-11:30 am

## 2016-03-27 NOTE — Discharge Summary (Signed)
[facility] Discharge Summary   Patient ID: Lydia DartingMelanie S Jones MRN: [id number] DOB/AGE: 39/12/1975 39 y.o.  Admit date: 03/26/2016 Discharge date: 03/27/2016  Admitting Diagnosis: Epigastric pain Acute appendicitis Non-intractable vomiting with nausea  Discharge Diagnosis Patient Active Problem List   Diagnosis Date Noted  . Acute appendicitis 03/26/2016  . Biliary dyskinesia 08/02/2013    Consultants None  Imaging: Ct Abdomen Pelvis W Contrast  Result Date: 03/26/2016 CLINICAL DATA:  40 year old female with a history of left upper quadrant pain EXAM: CT ABDOMEN AND PELVIS WITH CONTRAST TECHNIQUE: Multidetector CT imaging of the abdomen and pelvis was performed using the standard protocol following bolus administration of intravenous contrast. CONTRAST:  100mL ISOVUE-300 IOPAMIDOL (ISOVUE-300) INJECTION 61% COMPARISON:  No prior CT FINDINGS: Lower chest: No acute abnormality. Hepatobiliary: No focal liver abnormality is seen. Status post cholecystectomy. No biliary dilatation. Pancreas: Unremarkable. No pancreatic ductal dilatation or surrounding inflammatory changes. Spleen: Normal in size without focal abnormality. Adrenals/Urinary Tract: Adrenal glands are unremarkable. Kidneys are normal, without renal calculi, focal lesion, or hydronephrosis. Bladder is unremarkable. Stomach/Bowel: Unremarkable appearance of stomach. Small hiatal hernia. No abnormally distended small bowel. No transition point. Unremarkable appearance of colon. Early appendicitis with distal dilation of the appendix measuring 8 mm-9 mm. Appendicolith within the base the appendix. Vascular/Lymphatic: No aneurysm, no significant vascular calcifications. No dissection. Reproductive: Uterus and bilateral adnexa are unremarkable. Other: No abdominal wall hernia or abnormality. No abdominopelvic ascites. Musculoskeletal: No displaced fracture. Degenerative changes of L5-S1. No bony canal narrowing. IMPRESSION:  Evidence of early appendicitis, as above. No abscess or evidence of perforation. These results were called by telephone at the time of interpretation on 03/26/2016 at 9:34 am to Dr. Clydene PughKnott, who verbally acknowledged these results. Signed, Yvone NeuJaime S. Loreta AveWagner, DO [facility] Electronically Signed   By: Gilmer MorJaime  Wagner D.O.   On: 03/26/2016 09:36    Procedures Dr. Magnus IvanBlackman (03/26/16) - Laparoscopic Appendectomy  Hospital Course:  Lydia Jones is a 40yo female who [facility] with acute onset upper and right sided abdominal pain.  Workup showed early acute appendicitis.  Patient was admitted and underwent procedure listed above.  Tolerated procedure well and was transferred to the floor.  Diet was advanced as tolerated.  On POD1 the patient was voiding well, tolerating diet, ambulating well, pain well controlled, vital signs stable, incisions c/d/i and felt stable for discharge home.  Patient will follow up in our office in 3 weeks and knows to call with questions or concerns.   Physical Exam: Gen:  Alert, NAD, pleasant Card:  RRR, no M/G/R heard Pulm:  CTA, no W/R/R Abd: Soft, ND, +BS, appropriately tender, incisions C/D/I    Medication List    TAKE these medications   MIDOL 200 MG Caps Generic drug:  Ibuprofen Take 200 mg by mouth every 6 (six) hours as needed (pain).   ondansetron 4 MG disintegrating tablet Commonly known as:  ZOFRAN-ODT Take 1 tablet (4 mg total) by mouth every 6 (six) hours as needed for nausea.   oxyCODONE 5 MG immediate release tablet Commonly known as:  Oxy IR/ROXICODONE Take 1-2 tablets (5-10 mg total) by mouth every 4 (four) hours as needed for moderate pain or severe pain.   ranitidine 150 MG capsule Commonly known as:  ZANTAC Take 150 mg by mouth 2 (two) times daily as needed for heartburn.        Follow-up Information    BLACKMAN,DOUGLAS A, MD. Call today.  Specialty:  General  Surgery Why:  Please call as soon as you leave the hospital to make a follow-up appointment with Dr. Magnus Ivan in 3 weeks Contact information: [address] [place] [address] [phone]           Signed: Edson Snowball, [facility] 03/27/2016, 3:48 PM Pager: [phone] Consults: [phone] Mon-Fri 7:00 am-4:30 pm Sat-Sun 7:00 am-11:30 am

## 2016-03-27 NOTE — Discharge Instructions (Signed)

## 2017-02-06 DIAGNOSIS — M722 Plantar fascial fibromatosis: Secondary | ICD-10-CM | POA: Diagnosis not present

## 2017-02-06 DIAGNOSIS — M76821 Posterior tibial tendinitis, right leg: Secondary | ICD-10-CM | POA: Diagnosis not present

## 2017-02-06 DIAGNOSIS — M7731 Calcaneal spur, right foot: Secondary | ICD-10-CM | POA: Diagnosis not present

## 2017-02-06 DIAGNOSIS — M71571 Other bursitis, not elsewhere classified, right ankle and foot: Secondary | ICD-10-CM | POA: Diagnosis not present

## 2017-04-04 IMAGING — CT CT ABD-PELV W/ CM
2 of 4 series · 16 of 46 positions shown, 18 images · IV contrast (Omni 300)
Comparison: No prior CT

CLINICAL DATA: 40-year-old female with a history of left upper
quadrant pain

EXAM:
CT ABDOMEN AND PELVIS WITH CONTRAST
TECHNIQUE: Multidetector CT imaging of the abdomen and pelvis was performed
using the standard protocol following bolus administration of
intravenous contrast.
CONTRAST:  100mL FQ379Q-RQQ IOPAMIDOL (FQ379Q-RQQ) INJECTION 61%

[Series 2: a/p w/ 5mm · axial · 0.66mm/px · z∈[+86,+526]mm · 13 of 97 slices shown, 15 images]
[im 5/97  soft-tissue]
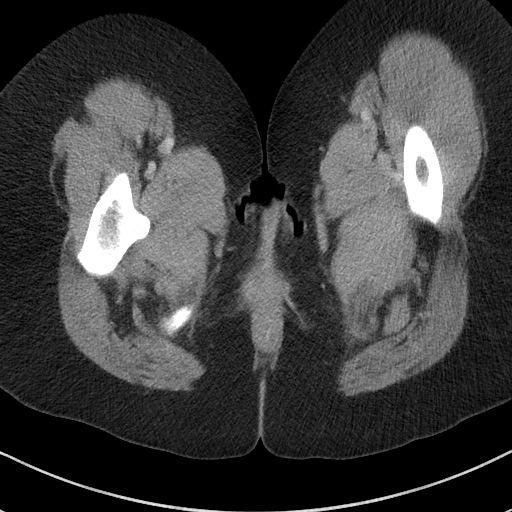
[im 5/97  bone]
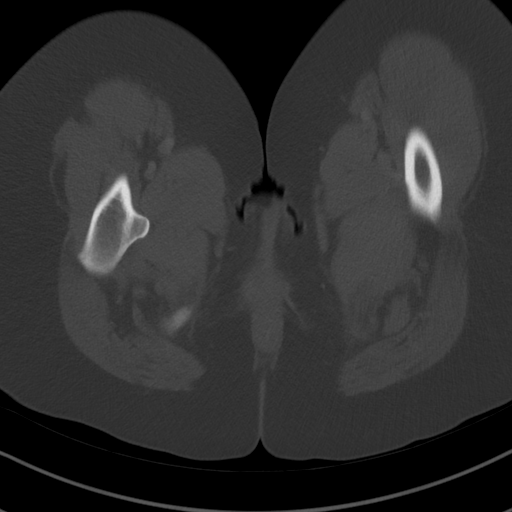
[im 13/97  soft-tissue]
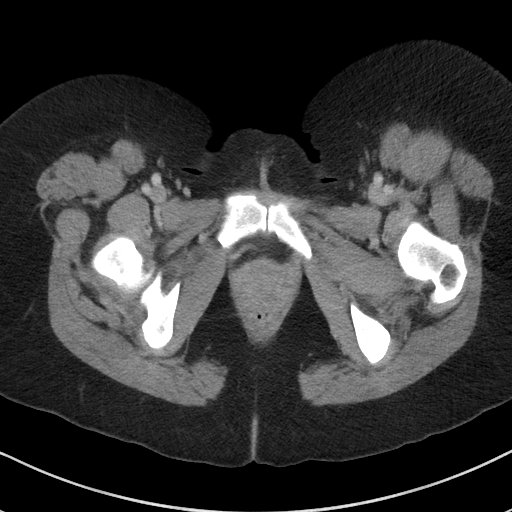
[im 21/97  soft-tissue]
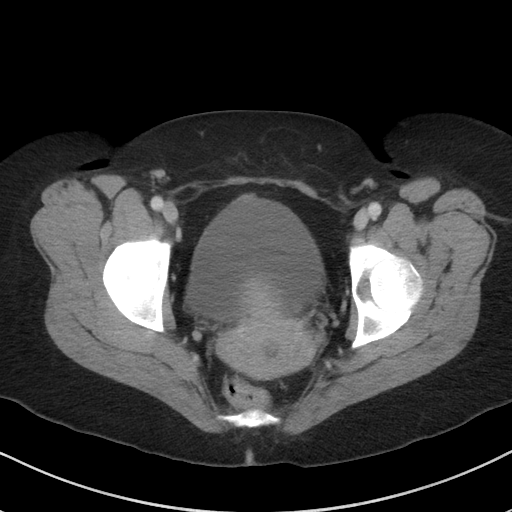
[im 29/97  soft-tissue]
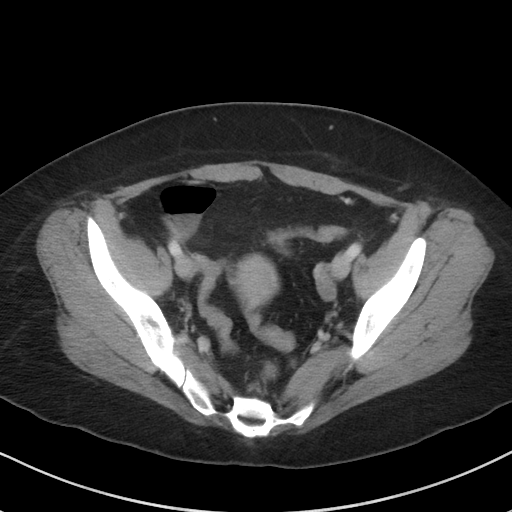
[im 33/97  soft-tissue]
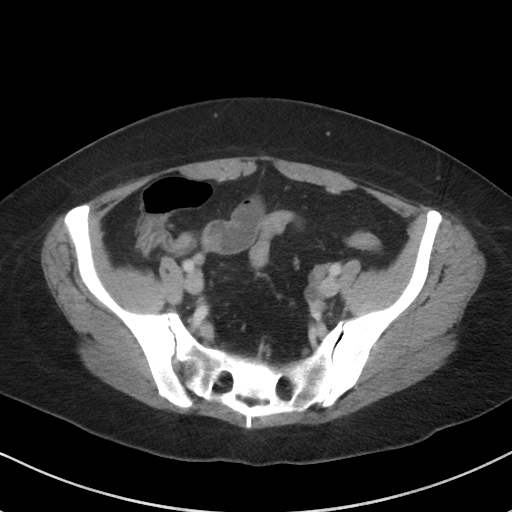
[im 41/97  soft-tissue]
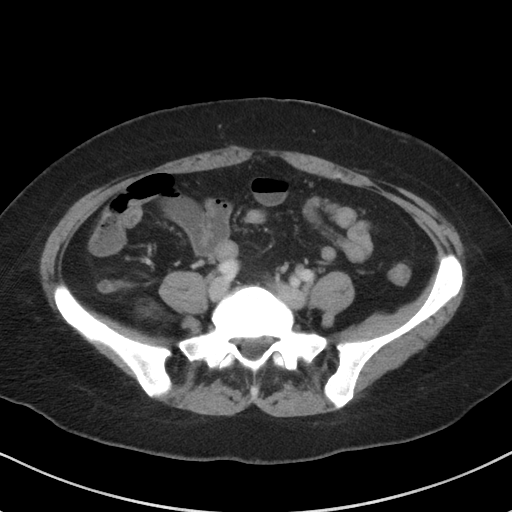
[im 49/97  soft-tissue]
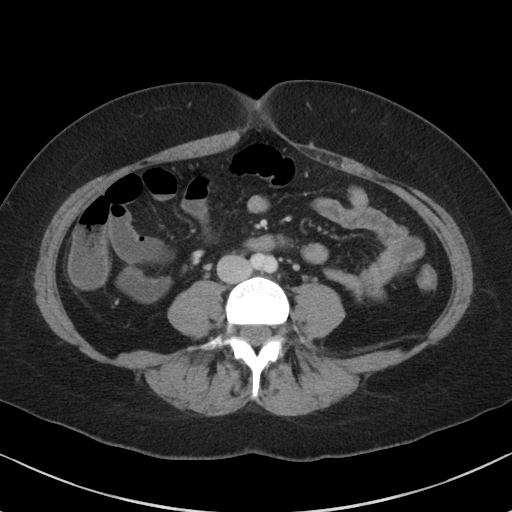
[im 57/97  soft-tissue]
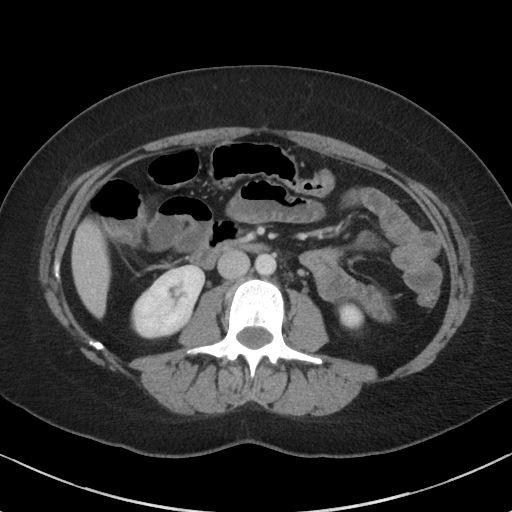
[im 65/97  soft-tissue]
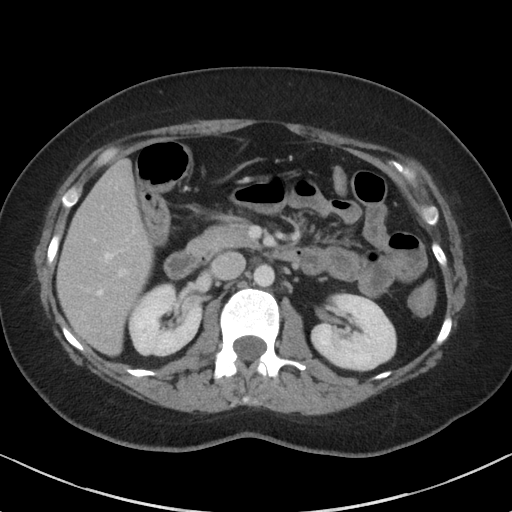
[im 65/97  bone]
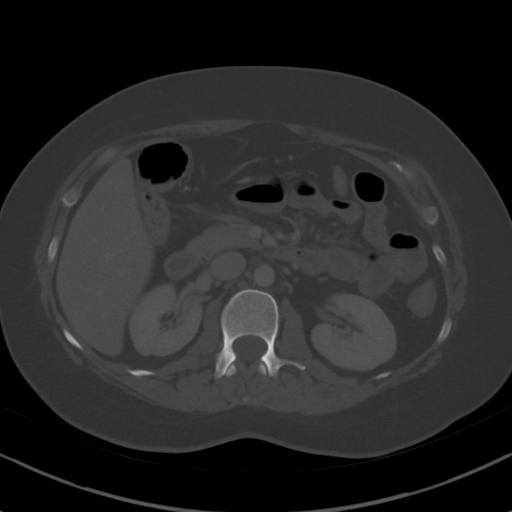
[im 69/97  soft-tissue]
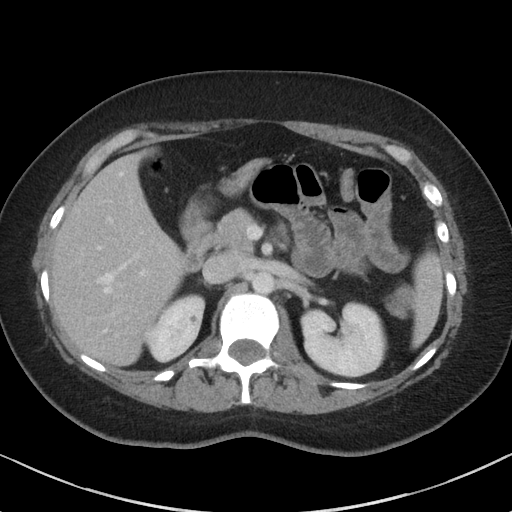
[im 77/97  soft-tissue]
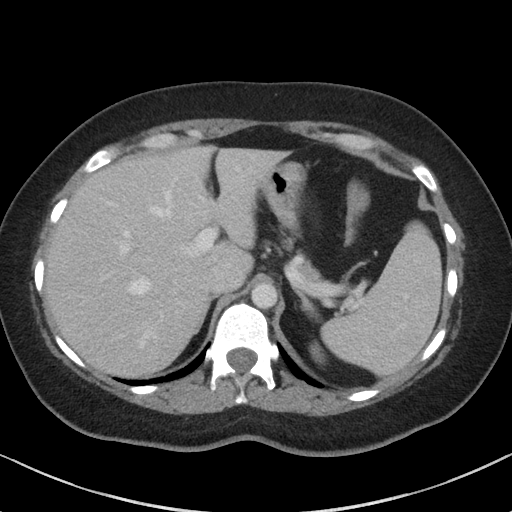
[im 85/97  soft-tissue]
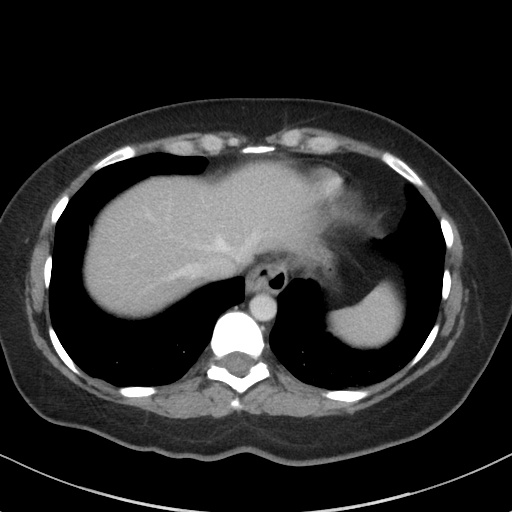
[im 93/97  soft-tissue]
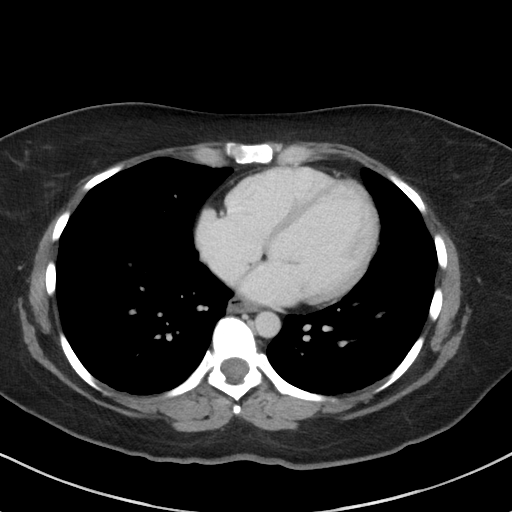

[Series 5: a/p w/ cor · coronal · 0.71mm/px · 3 of 130 slices shown]
[im 44/130  soft-tissue]
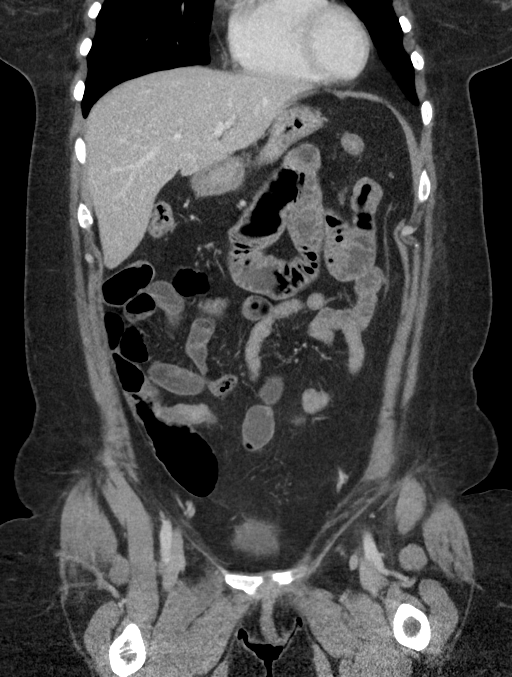
[im 58/130  soft-tissue]
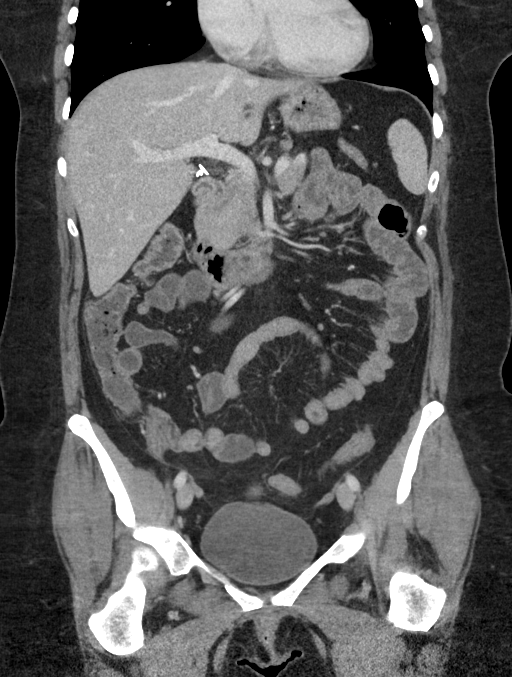
[im 72/130  soft-tissue]
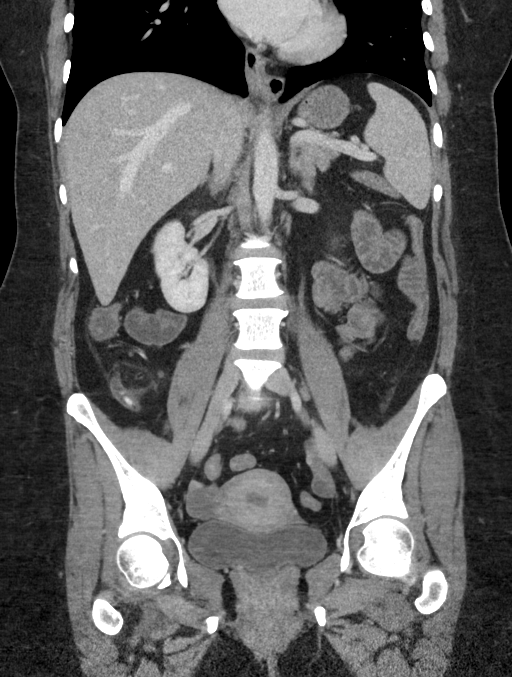

[16 of 46 positions shown; findings below may reference images not displayed]

FINDINGS: Lower chest: No acute abnormality.

Hepatobiliary: No focal liver abnormality is seen. Status post
cholecystectomy. No biliary dilatation.

Pancreas: Unremarkable. No pancreatic ductal dilatation or
surrounding inflammatory changes.

Spleen: Normal in size without focal abnormality.

Adrenals/Urinary Tract: Adrenal glands are unremarkable. Kidneys are
normal, without renal calculi, focal lesion, or hydronephrosis.
Bladder is unremarkable.

Stomach/Bowel: Unremarkable appearance of stomach. Small hiatal
hernia.

No abnormally distended small bowel. No transition point.
Unremarkable appearance of colon.

Early appendicitis with distal dilation of the appendix measuring 8
mm-9 mm. Appendicolith within the base the appendix.

Vascular/Lymphatic: No aneurysm, no significant vascular
calcifications. No dissection.

Reproductive: Uterus and bilateral adnexa are unremarkable.

Other: No abdominal wall hernia or abnormality. No abdominopelvic
ascites.

Musculoskeletal: No displaced fracture. Degenerative changes of
L5-S1. No bony canal narrowing.
IMPRESSION: Evidence of early appendicitis, as above. No abscess or evidence of
perforation.

These results were called by telephone at the time of interpretation
on 03/26/2016 at [DATE] to Dr. Nomasibulele, who verbally acknowledged
these results.

## 2017-04-18 DIAGNOSIS — M62838 Other muscle spasm: Secondary | ICD-10-CM | POA: Diagnosis not present

## 2017-04-18 DIAGNOSIS — G44219 Episodic tension-type headache, not intractable: Secondary | ICD-10-CM | POA: Diagnosis not present

## 2017-04-18 DIAGNOSIS — K219 Gastro-esophageal reflux disease without esophagitis: Secondary | ICD-10-CM | POA: Diagnosis not present

## 2017-08-08 DIAGNOSIS — Z01419 Encounter for gynecological examination (general) (routine) without abnormal findings: Secondary | ICD-10-CM | POA: Diagnosis not present

## 2017-08-08 DIAGNOSIS — Z6830 Body mass index (BMI) 30.0-30.9, adult: Secondary | ICD-10-CM | POA: Diagnosis not present

## 2017-09-19 DIAGNOSIS — G5603 Carpal tunnel syndrome, bilateral upper limbs: Secondary | ICD-10-CM | POA: Diagnosis not present

## 2017-09-19 DIAGNOSIS — M2550 Pain in unspecified joint: Secondary | ICD-10-CM | POA: Diagnosis not present

## 2017-11-25 DIAGNOSIS — R5383 Other fatigue: Secondary | ICD-10-CM | POA: Diagnosis not present

## 2017-11-25 DIAGNOSIS — M2550 Pain in unspecified joint: Secondary | ICD-10-CM | POA: Diagnosis not present

## 2017-12-12 DIAGNOSIS — M3500 Sicca syndrome, unspecified: Secondary | ICD-10-CM | POA: Diagnosis not present

## 2017-12-12 DIAGNOSIS — M2550 Pain in unspecified joint: Secondary | ICD-10-CM | POA: Diagnosis not present

## 2018-01-16 DIAGNOSIS — M722 Plantar fascial fibromatosis: Secondary | ICD-10-CM | POA: Diagnosis not present

## 2018-02-26 ENCOUNTER — Ambulatory Visit (INDEPENDENT_AMBULATORY_CARE_PROVIDER_SITE_OTHER): Payer: 59

## 2018-02-26 ENCOUNTER — Ambulatory Visit (INDEPENDENT_AMBULATORY_CARE_PROVIDER_SITE_OTHER): Payer: 59 | Admitting: Podiatry

## 2018-02-26 ENCOUNTER — Encounter: Payer: Self-pay | Admitting: Podiatry

## 2018-02-26 VITALS — Wt 175.0 lb

## 2018-02-26 DIAGNOSIS — M722 Plantar fascial fibromatosis: Secondary | ICD-10-CM

## 2018-02-26 MED ORDER — TRIAMCINOLONE ACETONIDE 10 MG/ML IJ SUSP
10.0000 mg | Freq: Once | INTRAMUSCULAR | Status: AC
  Administered 2018-02-26: 10 mg

## 2018-02-26 NOTE — Progress Notes (Signed)
   Subjective:    Patient ID: Lydia Jones, female    DOB: 1975-07-21, 42 y.o.   MRN: [id number]  HPI    Review of Systems  All other systems reviewed and are negative.      Objective:   Physical Exam        Assessment & Plan:

## 2018-02-26 NOTE — Patient Instructions (Signed)

## 2018-03-01 NOTE — Progress Notes (Signed)
Subjective:   Patient ID: Lydia Jones, female   DOB: 42 y.o.   MRN: [id number]   HPI Patient presents with long-term pain in the plantar aspect of the right heel that she is tried shoe gear modifications with stretching exercises and ice therapy.  Patient does not smoke and would like to be more active   Review of Systems  All other systems reviewed and are negative.       Objective:  Physical Exam  Constitutional: She appears well-developed and well-nourished.  Cardiovascular: Intact distal pulses.  Pulmonary/Chest: Effort normal.  Musculoskeletal: Normal range of motion.  Neurological: She is alert.  Skin: Skin is warm.  Nursing note and vitals reviewed.   Neurovascular status intact muscle strength is adequate range of motion within normal limits with patient found to have acute discomfort plantar aspect right heel at the insertional point of the tendon into the calcaneus with inflammation fluid around the medial band.  Patient is noted to have good digital perfusion is well oriented x3 with moderate depression of the arch noted     Assessment:  Acute plantar fasciitis right with inflammation fluid of the medial band     Plan:  H&P conditions reviewed and today I injected the plantar fascia right 3 mg Kenalog 5 mg Xylocaine and applied fascial brace with instructions on usage.  I then went ahead and casted for functional orthotics due to the long-standing nature and her success with orthotics that she had a number of years ago  X-ray indicates moderate depression of the arch with spur formation and no indications of stress fracture or advanced arthritis

## 2018-03-19 ENCOUNTER — Ambulatory Visit (INDEPENDENT_AMBULATORY_CARE_PROVIDER_SITE_OTHER): Payer: 59 | Admitting: Podiatry

## 2018-03-19 ENCOUNTER — Encounter: Payer: Self-pay | Admitting: Podiatry

## 2018-03-19 ENCOUNTER — Other Ambulatory Visit: Payer: 59 | Admitting: Orthotics

## 2018-03-19 DIAGNOSIS — M722 Plantar fascial fibromatosis: Secondary | ICD-10-CM

## 2018-03-19 NOTE — Progress Notes (Signed)
Subjective:   Patient ID: Lydia Jones, female   DOB: 42 y.o.   MRN: [id number]   HPI Patient states having minimal current discomfort uppercase satisfied with how I am doing   ROS      Objective:  Physical Exam  Neurovascular status intact with significant diminishment of discomfort plantar heel right at the insertional point tendon calcaneus     Assessment:  Significant improvement plantar fasciitis right     Plan:  Reviewed conditions and at this point recommended continued physical therapy anti-inflammatory supportive shoes and patient will be seen back as needed or if any issues should occur

## 2018-04-17 DIAGNOSIS — M2550 Pain in unspecified joint: Secondary | ICD-10-CM | POA: Diagnosis not present

## 2018-04-17 DIAGNOSIS — M3500 Sicca syndrome, unspecified: Secondary | ICD-10-CM | POA: Diagnosis not present

## 2018-08-05 DIAGNOSIS — H5213 Myopia, bilateral: Secondary | ICD-10-CM | POA: Diagnosis not present

## 2018-08-05 DIAGNOSIS — Z79899 Other long term (current) drug therapy: Secondary | ICD-10-CM | POA: Diagnosis not present

## 2018-10-19 DIAGNOSIS — M2550 Pain in unspecified joint: Secondary | ICD-10-CM | POA: Diagnosis not present

## 2018-10-19 DIAGNOSIS — M3500 Sicca syndrome, unspecified: Secondary | ICD-10-CM | POA: Diagnosis not present

## 2018-10-28 DIAGNOSIS — M3500 Sicca syndrome, unspecified: Secondary | ICD-10-CM | POA: Diagnosis not present

## 2018-11-06 DIAGNOSIS — K219 Gastro-esophageal reflux disease without esophagitis: Secondary | ICD-10-CM | POA: Diagnosis not present

## 2018-12-11 DIAGNOSIS — Z01419 Encounter for gynecological examination (general) (routine) without abnormal findings: Secondary | ICD-10-CM | POA: Diagnosis not present

## 2018-12-11 DIAGNOSIS — N926 Irregular menstruation, unspecified: Secondary | ICD-10-CM | POA: Diagnosis not present

## 2018-12-11 DIAGNOSIS — Z1231 Encounter for screening mammogram for malignant neoplasm of breast: Secondary | ICD-10-CM | POA: Diagnosis not present

## 2018-12-11 DIAGNOSIS — Z6832 Body mass index (BMI) 32.0-32.9, adult: Secondary | ICD-10-CM | POA: Diagnosis not present

## 2019-04-29 DIAGNOSIS — M3500 Sicca syndrome, unspecified: Secondary | ICD-10-CM | POA: Diagnosis not present

## 2019-04-29 DIAGNOSIS — M2550 Pain in unspecified joint: Secondary | ICD-10-CM | POA: Diagnosis not present

## 2019-05-12 DIAGNOSIS — Z20828 Contact with and (suspected) exposure to other viral communicable diseases: Secondary | ICD-10-CM | POA: Diagnosis not present

## 2019-05-12 DIAGNOSIS — Z9189 Other specified personal risk factors, not elsewhere classified: Secondary | ICD-10-CM | POA: Diagnosis not present

## 2019-05-15 DIAGNOSIS — Z20828 Contact with and (suspected) exposure to other viral communicable diseases: Secondary | ICD-10-CM | POA: Diagnosis not present

## 2019-11-12 DIAGNOSIS — M2550 Pain in unspecified joint: Secondary | ICD-10-CM | POA: Diagnosis not present

## 2019-11-12 DIAGNOSIS — M3500 Sicca syndrome, unspecified: Secondary | ICD-10-CM | POA: Diagnosis not present

## 2019-12-31 DIAGNOSIS — Z01419 Encounter for gynecological examination (general) (routine) without abnormal findings: Secondary | ICD-10-CM | POA: Diagnosis not present

## 2019-12-31 DIAGNOSIS — N760 Acute vaginitis: Secondary | ICD-10-CM | POA: Diagnosis not present

## 2019-12-31 DIAGNOSIS — Z6831 Body mass index (BMI) 31.0-31.9, adult: Secondary | ICD-10-CM | POA: Diagnosis not present

## 2019-12-31 DIAGNOSIS — Z1231 Encounter for screening mammogram for malignant neoplasm of breast: Secondary | ICD-10-CM | POA: Diagnosis not present

## 2020-02-23 DIAGNOSIS — Z20822 Contact with and (suspected) exposure to covid-19: Secondary | ICD-10-CM | POA: Diagnosis not present

## 2020-03-14 DIAGNOSIS — J101 Influenza due to other identified influenza virus with other respiratory manifestations: Secondary | ICD-10-CM | POA: Diagnosis not present

## 2020-03-14 DIAGNOSIS — R05 Cough: Secondary | ICD-10-CM | POA: Diagnosis not present

## 2020-03-14 DIAGNOSIS — Z20822 Contact with and (suspected) exposure to covid-19: Secondary | ICD-10-CM | POA: Diagnosis not present

## 2020-03-23 ENCOUNTER — Ambulatory Visit (HOSPITAL_COMMUNITY)
Admission: RE | Admit: 2020-03-23 | Discharge: 2020-03-23 | Disposition: A | Discharge status: Home / Self Care | Payer: BC Managed Care – PPO | Source: Ambulatory Visit | Attending: Pulmonary Disease | Admitting: Pulmonary Disease

## 2020-03-23 ENCOUNTER — Other Ambulatory Visit: Payer: Self-pay | Admitting: Oncology

## 2020-03-23 DIAGNOSIS — U071 COVID-19: Secondary | ICD-10-CM | POA: Diagnosis not present

## 2020-03-23 MED ORDER — SODIUM CHLORIDE 0.9 % IV SOLN: INTRAVENOUS | Status: DC | PRN

## 2020-03-23 MED ORDER — FAMOTIDINE IN NACL 20-0.9 MG/50ML-% IV SOLN: 20.0000 mg | Freq: Once | INTRAVENOUS | Status: DC | PRN

## 2020-03-23 MED ORDER — DIPHENHYDRAMINE HCL 50 MG/ML IJ SOLN: 50.0000 mg | Freq: Once | INTRAMUSCULAR | Status: DC | PRN

## 2020-03-23 MED ORDER — EPINEPHRINE 0.3 MG/0.3ML IJ SOAJ: 0.3000 mg | Freq: Once | INTRAMUSCULAR | Status: DC | PRN

## 2020-03-23 MED ORDER — SODIUM CHLORIDE 0.9 % IV SOLN
1200.0000 mg | Freq: Once | INTRAVENOUS | Status: AC
  Administered 2020-03-23: 1200 mg via INTRAVENOUS

## 2020-03-23 MED ORDER — METHYLPREDNISOLONE SODIUM SUCC 125 MG IJ SOLR: 125.0000 mg | Freq: Once | INTRAMUSCULAR | Status: DC | PRN

## 2020-03-23 MED ORDER — ALBUTEROL SULFATE HFA 108 (90 BASE) MCG/ACT IN AERS: 2.0000 | INHALATION_SPRAY | Freq: Once | RESPIRATORY_TRACT | Status: DC | PRN

## 2020-03-23 NOTE — Progress Notes (Signed)
  Diagnosis: COVID-19  Physician: Dr. Wright  Procedure: [facility] Infusion [facility] Med: casirivimab\imdevimab infusion - Provided patient with casirivimab\imdevimab fact sheet for patients, parents and caregivers prior to infusion.  Complications: No immediate complications noted.  Discharge: Discharged home   Neil Errickson J Rebecca Cairns 03/23/2020  

## 2020-03-23 NOTE — Discharge Instructions (Signed)

## 2020-03-23 NOTE — Progress Notes (Signed)
I connected by phone with  Mrs. Ghosh to discuss the potential use of an new treatment for mild to moderate COVID-19 viral infection in non-hospitalized patients.   This patient is a age/sex that meets the FDA criteria for Emergency Use Authorization of casirivimab\imdevimab.  Has a (+) direct SARS-CoV-2 viral test result 1. Has mild or moderate COVID-19  2. Is ? 43 years of age and weighs ? 40 kg 3. Is NOT hospitalized due to COVID-19 4. Is NOT requiring oxygen therapy or requiring an increase in baseline oxygen flow rate due to COVID-19 5. Is within 10 days of symptom onset 6. Has at least one of the high risk factor(s) for progression to severe COVID-19 and/or hospitalization as defined in EUA. Specific high risk criteria : Past Medical History:  Diagnosis Date  . Acid reflux   . Complication of anesthesia   . PONV (postoperative nausea and vomiting)   ?   Symptom onset  03/14/20   I have spoken and communicated the following to the patient or parent/caregiver:   1. FDA has authorized the emergency use of casirivimab\imdevimab for the treatment of mild to moderate COVID-19 in adults and pediatric patients with positive results of direct SARS-CoV-2 viral testing who are 24 years of age and older weighing at least 40 kg, and who are at high risk for progressing to severe COVID-19 and/or hospitalization.   2. The significant known and potential risks and benefits of casirivimab\imdevimab, and the extent to which such potential risks and benefits are unknown.   3. Information on available alternative treatments and the risks and benefits of those alternatives, including clinical trials.   4. Patients treated with casirivimab\imdevimab should continue to self-isolate and use infection control measures (e.g., wear mask, isolate, social distance, avoid sharing personal items, clean and disinfect "high touch" surfaces, and frequent handwashing) according to CDC guidelines.    5. The  patient or parent/caregiver has the option to accept or refuse casirivimab\imdevimab .   After reviewing this information with the patient, The patient agreed to proceed with receiving casirivimab\imdevimab infusion and will be provided a copy of the Fact sheet prior to receiving the infusion.Mignon Pine, AGNP-C [phone] (Infusion Center Hotline)

## 2020-03-24 ENCOUNTER — Other Ambulatory Visit (HOSPITAL_COMMUNITY): Payer: Self-pay

## 2020-05-12 DIAGNOSIS — M3500 Sicca syndrome, unspecified: Secondary | ICD-10-CM | POA: Diagnosis not present

## 2020-05-12 DIAGNOSIS — M2550 Pain in unspecified joint: Secondary | ICD-10-CM | POA: Diagnosis not present

## 2020-06-02 DIAGNOSIS — K219 Gastro-esophageal reflux disease without esophagitis: Secondary | ICD-10-CM | POA: Diagnosis not present

## 2020-06-02 DIAGNOSIS — B351 Tinea unguium: Secondary | ICD-10-CM | POA: Diagnosis not present

## 2020-06-02 DIAGNOSIS — R590 Localized enlarged lymph nodes: Secondary | ICD-10-CM | POA: Diagnosis not present

## 2020-08-22 DIAGNOSIS — Z20822 Contact with and (suspected) exposure to covid-19: Secondary | ICD-10-CM | POA: Diagnosis not present

## 2020-10-13 DIAGNOSIS — Z79899 Other long term (current) drug therapy: Secondary | ICD-10-CM | POA: Diagnosis not present

## 2020-10-13 DIAGNOSIS — H5213 Myopia, bilateral: Secondary | ICD-10-CM | POA: Diagnosis not present

## 2020-11-08 DIAGNOSIS — M3500 Sicca syndrome, unspecified: Secondary | ICD-10-CM | POA: Diagnosis not present

## 2020-11-08 DIAGNOSIS — M2550 Pain in unspecified joint: Secondary | ICD-10-CM | POA: Diagnosis not present

## 2021-01-11 DIAGNOSIS — Z6829 Body mass index (BMI) 29.0-29.9, adult: Secondary | ICD-10-CM | POA: Diagnosis not present

## 2021-01-11 DIAGNOSIS — Z01419 Encounter for gynecological examination (general) (routine) without abnormal findings: Secondary | ICD-10-CM | POA: Diagnosis not present

## 2021-01-11 DIAGNOSIS — Z1231 Encounter for screening mammogram for malignant neoplasm of breast: Secondary | ICD-10-CM | POA: Diagnosis not present

## 2021-05-02 DIAGNOSIS — M3500 Sicca syndrome, unspecified: Secondary | ICD-10-CM | POA: Diagnosis not present

## 2021-06-22 DIAGNOSIS — R21 Rash and other nonspecific skin eruption: Secondary | ICD-10-CM | POA: Diagnosis not present

## 2021-07-23 DIAGNOSIS — M3500 Sicca syndrome, unspecified: Secondary | ICD-10-CM | POA: Diagnosis not present

## 2021-07-23 DIAGNOSIS — M2550 Pain in unspecified joint: Secondary | ICD-10-CM | POA: Diagnosis not present

## 2021-10-19 DIAGNOSIS — Z79899 Other long term (current) drug therapy: Secondary | ICD-10-CM | POA: Diagnosis not present

## 2021-10-19 DIAGNOSIS — H5213 Myopia, bilateral: Secondary | ICD-10-CM | POA: Diagnosis not present

## 2022-01-07 DIAGNOSIS — M545 Low back pain, unspecified: Secondary | ICD-10-CM | POA: Diagnosis not present

## 2022-01-07 DIAGNOSIS — M3500 Sicca syndrome, unspecified: Secondary | ICD-10-CM | POA: Diagnosis not present

## 2022-01-07 DIAGNOSIS — M1991 Primary osteoarthritis, unspecified site: Secondary | ICD-10-CM | POA: Diagnosis not present

## 2022-01-17 DIAGNOSIS — Z01419 Encounter for gynecological examination (general) (routine) without abnormal findings: Secondary | ICD-10-CM | POA: Diagnosis not present

## 2022-01-17 DIAGNOSIS — Z6832 Body mass index (BMI) 32.0-32.9, adult: Secondary | ICD-10-CM | POA: Diagnosis not present

## 2022-01-17 DIAGNOSIS — Z1231 Encounter for screening mammogram for malignant neoplasm of breast: Secondary | ICD-10-CM | POA: Diagnosis not present

## 2022-02-08 DIAGNOSIS — M5451 Vertebrogenic low back pain: Secondary | ICD-10-CM | POA: Diagnosis not present

## 2022-04-17 DIAGNOSIS — M5451 Vertebrogenic low back pain: Secondary | ICD-10-CM | POA: Diagnosis not present

## 2022-04-23 DIAGNOSIS — M5451 Vertebrogenic low back pain: Secondary | ICD-10-CM | POA: Diagnosis not present

## 2022-04-23 DIAGNOSIS — M7061 Trochanteric bursitis, right hip: Secondary | ICD-10-CM | POA: Diagnosis not present

## 2022-07-10 DIAGNOSIS — M5136 Other intervertebral disc degeneration, lumbar region: Secondary | ICD-10-CM | POA: Diagnosis not present

## 2022-07-10 DIAGNOSIS — M3500 Sicca syndrome, unspecified: Secondary | ICD-10-CM | POA: Diagnosis not present

## 2022-07-10 DIAGNOSIS — M1991 Primary osteoarthritis, unspecified site: Secondary | ICD-10-CM | POA: Diagnosis not present

## 2022-10-22 DIAGNOSIS — H5213 Myopia, bilateral: Secondary | ICD-10-CM | POA: Diagnosis not present

## 2022-10-22 DIAGNOSIS — Z79899 Other long term (current) drug therapy: Secondary | ICD-10-CM | POA: Diagnosis not present

## 2022-11-01 DIAGNOSIS — H16011 Central corneal ulcer, right eye: Secondary | ICD-10-CM | POA: Diagnosis not present

## 2022-11-04 DIAGNOSIS — H16041 Marginal corneal ulcer, right eye: Secondary | ICD-10-CM | POA: Diagnosis not present

## 2023-01-08 DIAGNOSIS — M3501 Sicca syndrome with keratoconjunctivitis: Secondary | ICD-10-CM | POA: Diagnosis not present

## 2023-01-08 DIAGNOSIS — M5136 Other intervertebral disc degeneration, lumbar region: Secondary | ICD-10-CM | POA: Diagnosis not present

## 2023-01-08 DIAGNOSIS — M1991 Primary osteoarthritis, unspecified site: Secondary | ICD-10-CM | POA: Diagnosis not present

## 2023-01-08 DIAGNOSIS — I7300 Raynaud's syndrome without gangrene: Secondary | ICD-10-CM | POA: Diagnosis not present

## 2023-01-31 DIAGNOSIS — Z01419 Encounter for gynecological examination (general) (routine) without abnormal findings: Secondary | ICD-10-CM | POA: Diagnosis not present

## 2023-01-31 DIAGNOSIS — Z1231 Encounter for screening mammogram for malignant neoplasm of breast: Secondary | ICD-10-CM | POA: Diagnosis not present

## 2023-01-31 DIAGNOSIS — Z6833 Body mass index (BMI) 33.0-33.9, adult: Secondary | ICD-10-CM | POA: Diagnosis not present

## 2023-02-27 DIAGNOSIS — I7300 Raynaud's syndrome without gangrene: Secondary | ICD-10-CM | POA: Diagnosis not present

## 2023-02-27 DIAGNOSIS — M3501 Sicca syndrome with keratoconjunctivitis: Secondary | ICD-10-CM | POA: Diagnosis not present

## 2023-02-27 DIAGNOSIS — M1991 Primary osteoarthritis, unspecified site: Secondary | ICD-10-CM | POA: Diagnosis not present

## 2023-02-27 DIAGNOSIS — M5136 Other intervertebral disc degeneration, lumbar region: Secondary | ICD-10-CM | POA: Diagnosis not present

## 2023-04-11 DIAGNOSIS — M3500 Sicca syndrome, unspecified: Secondary | ICD-10-CM | POA: Diagnosis not present

## 2023-04-11 DIAGNOSIS — R519 Headache, unspecified: Secondary | ICD-10-CM | POA: Diagnosis not present

## 2023-07-04 DIAGNOSIS — R0789 Other chest pain: Secondary | ICD-10-CM | POA: Diagnosis not present

## 2023-07-04 DIAGNOSIS — R000 Tachycardia, unspecified: Secondary | ICD-10-CM | POA: Diagnosis not present

## 2023-07-09 DIAGNOSIS — M1991 Primary osteoarthritis, unspecified site: Secondary | ICD-10-CM | POA: Diagnosis not present

## 2023-07-09 DIAGNOSIS — M3501 Sicca syndrome with keratoconjunctivitis: Secondary | ICD-10-CM | POA: Diagnosis not present

## 2023-07-09 DIAGNOSIS — M5136 Other intervertebral disc degeneration, lumbar region with discogenic back pain only: Secondary | ICD-10-CM | POA: Diagnosis not present

## 2023-07-09 DIAGNOSIS — I7300 Raynaud's syndrome without gangrene: Secondary | ICD-10-CM | POA: Diagnosis not present

## 2023-07-11 ENCOUNTER — Ambulatory Visit (INDEPENDENT_AMBULATORY_CARE_PROVIDER_SITE_OTHER): Payer: BC Managed Care – PPO

## 2023-07-11 ENCOUNTER — Ambulatory Visit: Payer: BC Managed Care – PPO | Attending: Cardiology | Admitting: Cardiology

## 2023-07-11 VITALS — BP 120/78 | HR 105 | Resp 16 | Ht 63.0 in | Wt 193.2 lb

## 2023-07-11 DIAGNOSIS — R000 Tachycardia, unspecified: Secondary | ICD-10-CM

## 2023-07-11 DIAGNOSIS — R002 Palpitations: Secondary | ICD-10-CM | POA: Diagnosis not present

## 2023-07-11 DIAGNOSIS — M3500 Sicca syndrome, unspecified: Secondary | ICD-10-CM | POA: Diagnosis not present

## 2023-07-11 NOTE — Progress Notes (Unsigned)
Applied a 14 day Zio XT monitor to patient in the office ?

## 2023-07-11 NOTE — Patient Instructions (Addendum)
Medication Instructions:  Your physician recommends that you continue on your current medications as directed. Please refer to the Current Medication list given to you today.  *If you need a refill on your cardiac medications before your next appointment, please call your pharmacy*  Lab Work: To be completed in 1 week: TSH If you have labs (blood work) drawn today and your tests are completely normal, you will receive your results only by: MyChart Message (if you have MyChart) OR A paper copy in the mail If you have any lab test that is abnormal or we need to change your treatment, we will call you to review the results.  Testing/Procedures: Your physician has requested that you have an echocardiogram. Echocardiography is a painless test that uses sound waves to create images of your heart. It provides your doctor with information about the size and shape of your heart and how well your heart's chambers and valves are working. This procedure takes approximately one hour. There are no restrictions for this procedure. Please do NOT wear cologne, perfume, aftershave, or lotions (deodorant is allowed). Please arrive 15 minutes prior to your appointment time.  Please note: We ask at that you not bring children with you during ultrasound (echo/ vascular) testing. Due to room size and safety concerns, children are not allowed in the ultrasound rooms during exams. Our front office staff cannot provide observation of children in our lobby area while testing is being conducted. An adult accompanying a patient to their appointment will only be allowed in the ultrasound room at the discretion of the ultrasound technician under special circumstances. We apologize for any inconvenience.   Your physician has requested that you wear a Zio heart monitor for 14 days. This will be mailed to your home with instructions on how to apply the monitor and how to return it when finished. Please allow 2 weeks after  returning the heart monitor before our office calls you with the results.   Follow-Up: At [facility], you and your health needs are our priority.  As part of our continuing mission to provide you with exceptional heart care, we have created designated Provider Care Teams.  These Care Teams include your primary Cardiologist (physician) and Advanced Practice Providers (APPs -  Physician Assistants and Nurse Practitioners) who all work together to provide you with the care you need, when you need it.  We recommend signing up for the patient portal called "MyChart".  Sign up information is provided on this After Visit Summary.  MyChart is used to connect with patients for Virtual Visits (Telemedicine).  Patients are able to view lab/test results, encounter notes, upcoming appointments, etc.  Non-urgent messages can be sent to your provider as well.   To learn more about what you can do with MyChart, go to [email or web address].    Your next appointment:   6 month(s)  The format for your next appointment:   In Person  Provider:   Tessa Lerner, DO {  Other Instructions ZIO XT- Long Term Monitor Instructions     Your physician has requested you wear a ZIO patch monitor for 14 days.  This is a single patch monitor. [employer] supplies one patch monitor per enrollment. Additional  stickers are not available. Please do not apply patch if you will be having a Nuclear Stress Test,  Echocardiogram, Cardiac CT, MRI, or Chest Xray during the period you would be wearing the  monitor. The patch cannot be worn during these tests. You cannot remove and  re-apply the  ZIO XT patch monitor.  Your ZIO patch monitor will be mailed 3 day USPS to your address on file. It may take 3-5 days  to receive your monitor after you have been enrolled.  Once you have received your monitor, please review the enclosed instructions. Your monitor  has already been registered assigning a specific monitor serial # to you.      Billing and Patient Assistance Program Information     We have supplied [employer] with any of your insurance information on file for billing purposes.  [employer] offers a sliding scale Patient Assistance Program for patients that do not have  insurance, or whose insurance does not completely cover the cost of the ZIO monitor.  You must apply for the Patient Assistance Program to qualify for this discounted rate.  To apply, please call [employer] at [phone], select option 4, select option 2, ask to apply for  Patient Assistance Program. Meredeth Ide will ask your household income, and how many people  are in your household. They will quote your out-of-pocket cost based on that information.  [employer] will also be able to set up a 42-month, interest-free payment plan if needed.     Applying the monitor     Shave hair from upper left chest.  Hold abrader disc by orange tab. Rub abrader in 40 strokes over the upper left chest as  indicated in your monitor instructions.  Clean area with 4 enclosed alcohol pads. Let dry.  Apply patch as indicated in monitor instructions. Patch will be placed under collarbone on left  side of chest with arrow pointing upward.  Rub patch adhesive wings for 2 minutes. Remove white label marked "1". Remove the white  label marked "2". Rub patch adhesive wings for 2 additional minutes.  While looking in a mirror, press and release button in center of patch. A small green light will  flash 3-4 times. This will be your only indicator that the monitor has been turned on.  Do not shower for the first 24 hours. You may shower after the first 24 hours.  Press the button if you feel a symptom. You will hear a small click. Record Date, Time and  Symptom in the Patient Logbook.  When you are ready to remove the patch, follow instructions on the last 2 pages of Patient  Logbook. Stick patch monitor onto the last page of Patient Logbook.  Place Patient Logbook in the blue and white  box. Use locking tab on box and tape box closed  securely. The blue and white box has prepaid postage on it. Please place it in the mailbox as  soon as possible. Your physician should have your test results approximately 7 days after the  monitor has been mailed back to [facility].  Call [facility] Customer Care at [phone] if you have questions regarding  your ZIO XT patch monitor. Call them immediately if you see an orange light blinking on your  monitor.  If your monitor falls off in less than 4 days, contact our Monitor department at [phone].  If your monitor becomes loose or falls off after 4 days call [employer] at [phone] for  suggestions on securing your monitor.

## 2023-07-11 NOTE — Progress Notes (Unsigned)
Cardiology Office Note:    NAME:  Lydia Jones    MRN: [id number] DOB:  03-05-1976   PCP:  Genia Hotter, FNP  Former Cardiology Providers: None Primary Cardiologist:  Tessa Lerner, DO, [facility] (established care 07/11/2023) Electrophysiologist:  None   Referring MD: Stamey, Verda Cumins, FNP  Reason of Consult: Tachycardia  Chief Complaint  Patient presents with   Tachycardia   New Patient (Initial Visit)    History of Present Illness:    Lydia Jones is a 48 y.o. Caucasian female whose past medical history and cardiovascular risk factors includes: Sjogren's disease, OA of hips. She is being seen today for the evaluation of tachycardia at the request of Stamey, Verda Cumins, FNP.  Presents with a month-long history of heart palpitations and dizziness. She reports that these symptoms occur when transitioning from a resting position to standing. She describes the sensation as her "heart going crazy" and feeling dizzy, sometimes accompanied by shortness of breath. She has been monitoring her heart rate with an iWatch, which shows a resting heart rate of 75-85 bpm that jumps to 120-140 bpm upon standing. In the mornings, her resting heart rate is in the 60s, but it can jump to as high as 148 bpm upon standing. She also describes occasional "zingers," like an electric current sensation during heart palpitations.  Symptoms have been ongoing for the past 1-1.5 months.  No change in the intensity frequency or duration.  Patient denies any near-syncope or syncopal events.  Factors to consider: History of thyroid disease: none History of anemia: none Coffee consumption: 1 cup / day  Caffeinated beverages/sodas: none Energy drinks: none Alcohol use: none Stimulants: none Illicit drugs: none Weight loss supplements: none New over-the-counter medications: none Herbal supplements: none  Current Medications: Current Meds  Medication Sig   hydroxychloroquine (PLAQUENIL) 200 MG tablet  TAKE 1 TABLET BY MOUTH TWICE A DAY WITH FOOD OR MILK   Ibuprofen (MIDOL) 200 MG CAPS Take 200 mg by mouth every 6 (six) hours as needed (pain).   meloxicam (MOBIC) 15 MG tablet meloxicam 15 mg tablet     Allergies:    Sulfa antibiotics   Past Medical History: Past Medical History:  Diagnosis Date   Acid reflux    Complication of anesthesia    PONV (postoperative nausea and vomiting)     Past Surgical History: Past Surgical History:  Procedure Laterality Date   APPENDECTOMY  03/26/2016   CESAREAN SECTION     CHOLECYSTECTOMY     LAPAROSCOPIC APPENDECTOMY N/A 03/26/2016   Procedure: APPENDECTOMY LAPAROSCOPIC;  Surgeon: Abigail Miyamoto, MD;  Location: [facility] OR;  Service: General;  Laterality: N/A;    Social History: Social History   Tobacco Use   Smoking status: Never   Smokeless tobacco: Never  Substance Use Topics   Alcohol use: No   Drug use: No    Family History: No family history on file.  ROS:   Review of Systems  Cardiovascular:  Positive for palpitations. Negative for chest pain, claudication, irregular heartbeat, leg swelling, near-syncope, orthopnea, paroxysmal nocturnal dyspnea and syncope.  Respiratory:  Negative for shortness of breath.   Hematologic/Lymphatic: Negative for bleeding problem.    EKGs/Labs/Other Studies Reviewed:   EKG: EKG Interpretation Date/Time:  Friday July 11 2023 15:42:11 EST Ventricular Rate:  102 PR Interval:  128 QRS Duration:  88 QT Interval:  308 QTC Calculation: 401 R Axis:   -20  Text Interpretation: Sinus tachycardia Low voltage QRS No previous ECGs available  Confirmed by Tessa Lerner [phone]) on 07/11/2023 4:20:42 PM  Labs: External Labs: Collected: July 04, 2023 provided by the patient performed at [facility] physicians. BUN 11, creatinine 0.66. Sodium 139, potassium 4.1, chloride 106, bicarb 28. AST, ALT, alkaline phosphatase within normal limits Hemoglobin 13.9, hematocrit 40.2%  Collected: April 11, 2023,  provided by the patient's performed at [facility] physicians. TSH 0.889  Physical Exam:    Today's Vitals   07/11/23 1538  BP: 120/78  Pulse: (!) 105  Resp: 16  SpO2: 98%  Weight: 193 lb 3.2 oz (87.6 kg)  Height: 5\' 3"  (1.6 m)   Body mass index is 34.22 kg/m. Wt Readings from Last 3 Encounters:  07/11/23 193 lb 3.2 oz (87.6 kg)  02/26/18 175 lb (79.4 kg)  09/10/13 148 lb (67.1 kg)    Orthostatic VS for the past 72 hrs (Last 3 readings):  Orthostatic BP Patient Position BP Location Cuff Size Orthostatic Pulse  07/11/23 1551 120/80 Standing Left Arm Normal 117  07/11/23 1550 124/76 Sitting Left Arm Normal 99  07/11/23 1549 124/78 Supine Left Arm Normal 99   Physical Exam  Constitutional: No distress.  hemodynamically stable  Neck: No JVD present.  Cardiovascular: Normal rate, regular rhythm, S1 normal and S2 normal. Exam reveals no gallop, no S3 and no S4.  No murmur heard. Pulmonary/Chest: Effort normal and breath sounds normal. No stridor. She has no wheezes. She has no rales.  Abdominal: Soft. Bowel sounds are normal. She exhibits no distension. There is no abdominal tenderness.  Musculoskeletal:        General: No edema.     Cervical back: Neck supple.  Neurological: She is alert and oriented to person, place, and time. She has intact cranial nerves (2-12).  Skin: Skin is warm.   Impression & Recommendation(s):  Impression:   ICD-10-CM   1. Tachycardia  R00.0 EKG 12-Lead    LONG TERM MONITOR (3-14 DAYS)    ECHOCARDIOGRAM COMPLETE    TSH    TSH    2. Palpitations  R00.2 LONG TERM MONITOR (3-14 DAYS)    ECHOCARDIOGRAM COMPLETE    TSH    TSH    3. Sjogren's syndrome  M35.00 ECHOCARDIOGRAM COMPLETE    TSH       Recommendation(s):  Tachycardia Palpitations Ongoing for the last 1-1.5 months. No identifiable root cause. No change in intensity frequency and duration. More noticeable with changing positions. No syncope or near syncopal events. Orthostatic vital  signs today negative. EKG: Nonischemic Outside labs independently reviewed as noted above from January 2025 Check TSH 2-week extended Holter monitor to evaluate for dysrhythmias Consider a symptom diary. Change positions slowly. Regular exercise recommended-walking on ground level for at least 30 minutes 5 days a week. Also discussed symptoms with her rheumatologist to see if Plaquenil could be contributory.  Sjogren's syndrome Currently on medical therapy. Follows with rheumatology. Plan echo to evaluate for structural heart disease and pericardial effusion.   Orders Placed:  Orders Placed This Encounter  Procedures   TSH    Standing Status:   Future    Number of Occurrences:   1    Expected Date:   07/18/2023    Expiration Date:   07/10/2024   LONG TERM MONITOR (3-14 DAYS)    Standing Status:   Future    Number of Occurrences:   1    Expected Date:   07/11/2023    Expiration Date:   07/10/2024    Where should this test be performed?:  [facility]    Does the patient have an implanted cardiac device?:   No    Prescribed days of wear:   14    Type of enrollment:   Clinic Enrollment   EKG 12-Lead   ECHOCARDIOGRAM COMPLETE    Standing Status:   Future    Expected Date:   07/18/2023    Expiration Date:   07/10/2024    Where should this test be performed:   [facility])    Does the patient weigh less than or greater than 250 lbs?:   Patient weighs less than 250 lbs    Perflutren DEFINITY (image enhancing agent) should be administered unless hypersensitivity or allergy exist:   Administer Perflutren    Reason for exam-Echo:   Other-Full Diagnosis List    Full ICD-10/Reason for Exam:   Sjogren's disease (HCC) [841324]    As part of medical decision making results of the outside labs dated 07/04/2023, orthostatics and EKG ordered and independently reviewed, discussed management and workup of palpitations/tachycardia, additional diagnostic testing ordered, and  plan of care discussed.    Final Medication List:   No orders of the defined types were placed in this encounter.    Current Outpatient Medications:    hydroxychloroquine (PLAQUENIL) 200 MG tablet, TAKE 1 TABLET BY MOUTH TWICE A DAY WITH FOOD OR MILK, Disp: , Rfl: 3   Ibuprofen (MIDOL) 200 MG CAPS, Take 200 mg by mouth every 6 (six) hours as needed (pain)., Disp: , Rfl:    meloxicam (MOBIC) 15 MG tablet, meloxicam 15 mg tablet, Disp: , Rfl:   Consent:   NA  Disposition:   6 months sooner if needed.  Patient may be asked to follow-up sooner based on the results of the above-mentioned testing.  Her questions and concerns were addressed to her satisfaction. She voices understanding of the recommendations provided during this encounter.    Signed, Tessa Lerner, DO, [facility]  [facility]  [address] [place], [place] [address] 07/13/2023 11:30 AM

## 2023-07-13 ENCOUNTER — Encounter: Payer: Self-pay | Admitting: Cardiology

## 2023-07-18 DIAGNOSIS — R002 Palpitations: Secondary | ICD-10-CM | POA: Diagnosis not present

## 2023-07-18 LAB — TSH: TSH: 1.1 u[IU]/mL (ref 0.450–4.500)

## 2023-07-31 DIAGNOSIS — R002 Palpitations: Secondary | ICD-10-CM | POA: Diagnosis not present

## 2023-07-31 DIAGNOSIS — R000 Tachycardia, unspecified: Secondary | ICD-10-CM | POA: Diagnosis not present

## 2023-08-04 ENCOUNTER — Ambulatory Visit (HOSPITAL_COMMUNITY): Payer: BC Managed Care – PPO | Attending: Cardiovascular Disease

## 2023-08-04 DIAGNOSIS — M3500 Sicca syndrome, unspecified: Secondary | ICD-10-CM | POA: Diagnosis not present

## 2023-08-04 DIAGNOSIS — R000 Tachycardia, unspecified: Secondary | ICD-10-CM | POA: Diagnosis not present

## 2023-08-04 DIAGNOSIS — R002 Palpitations: Secondary | ICD-10-CM | POA: Diagnosis not present

## 2023-08-04 LAB — ECHOCARDIOGRAM COMPLETE
Area-P 1/2: 3.37 cm2
S' Lateral: 3.43 cm

## 2023-08-08 ENCOUNTER — Telehealth: Payer: Self-pay | Admitting: Cardiovascular Disease

## 2023-08-08 NOTE — Telephone Encounter (Signed)
Patient was calling to get heart monitor and echocardiogram.

## 2023-08-12 ENCOUNTER — Telehealth: Payer: Self-pay | Admitting: Cardiology

## 2023-08-12 DIAGNOSIS — R072 Precordial pain: Secondary | ICD-10-CM

## 2023-08-12 DIAGNOSIS — R002 Palpitations: Secondary | ICD-10-CM

## 2023-08-12 MED ORDER — METOPROLOL TARTRATE 25 MG PO TABS: 25.0000 mg | ORAL_TABLET | Freq: Two times a day (BID) | ORAL | 0 refills | Status: DC

## 2023-08-12 NOTE — Telephone Encounter (Signed)
 Spoke with pt and she is having SOB, palpitations and lightheadedness still. She denies CP but states when she has having these other symptoms, she is having a "chest pressure" but then it goes away when she other symptoms go away. ED precautions explained with pt for if symptoms get worse or stay constant while waiting for recommendations from recent testing. Pt verbalized understanding and stated she would call back with any other questions or concerns. Information has been forwarded to Dr. Odis Hollingshead to review.

## 2023-08-12 NOTE — Telephone Encounter (Signed)
 Pt is still having heart palps, lightheadedness and SOB and wants to know results of monitor and Echo. Requesting cb

## 2023-08-12 NOTE — Telephone Encounter (Signed)
 Spoke to the patient over the phone this morning.  Patient states that when she wakes up she has been experiencing palpitations, shortness of breath, and left-sided chest pressure.  The pain in the chest is not brought on by effort related activities but concerning and wants it evaluated prior to resuming exercise routine.  We discussed risks, benefits, alternatives, and limitations of exercise treadmill stress test, nuclear SPECT imaging, and coronary CTA.  After informed decision making patient would like to proceed with coronary CTA.  Please start the patient on Lopressor 25 mg p.o. twice daily with holding parameters for palpitations.   Order coronary CTA, diagnosis precordial pain.  Recommended that she discuss sleep apnea evaluation with PCP as majority of her symptoms are brought on at the time of waking up in the morning.  Follow-up appointment after to discuss results.  Kotaro Buer Matthews, DO, [facility]

## 2023-08-12 NOTE — Addendum Note (Signed)
 Addended by: Cherylann Banas on: 08/12/2023 02:58 PM   Modules accepted: Orders

## 2023-08-13 NOTE — Telephone Encounter (Signed)
 Spoke to the patient over the phone - earlier this week. Multiple threads for the same. See EMR.   Regards,   Marnita Poirier Rosepine, DO, [facility]

## 2023-08-25 ENCOUNTER — Encounter (HOSPITAL_COMMUNITY): Payer: Self-pay

## 2023-08-28 ENCOUNTER — Ambulatory Visit (HOSPITAL_COMMUNITY)
Admission: RE | Admit: 2023-08-28 | Discharge: 2023-08-28 | Disposition: A | Discharge status: Home / Self Care | Payer: BC Managed Care – PPO | Source: Ambulatory Visit | Attending: Cardiology | Admitting: Cardiology

## 2023-08-28 DIAGNOSIS — R002 Palpitations: Secondary | ICD-10-CM | POA: Diagnosis not present

## 2023-08-28 DIAGNOSIS — R072 Precordial pain: Secondary | ICD-10-CM | POA: Diagnosis not present

## 2023-08-28 MED ORDER — NITROGLYCERIN 0.4 MG SL SUBL
SUBLINGUAL_TABLET | SUBLINGUAL | Status: AC
  Filled 2023-08-28: qty 2

## 2023-08-28 MED ORDER — NITROGLYCERIN 0.4 MG SL SUBL
0.8000 mg | SUBLINGUAL_TABLET | Freq: Once | SUBLINGUAL | Status: AC
  Administered 2023-08-28: 0.8 mg via SUBLINGUAL

## 2023-08-28 MED ORDER — IOHEXOL 350 MG/ML SOLN
95.0000 mL | Freq: Once | INTRAVENOUS | Status: AC | PRN
  Administered 2023-08-28: 95 mL via INTRAVENOUS

## 2023-08-28 NOTE — Progress Notes (Signed)
  Evaluation after Contrast Extravasation  Patient seen and examined immediately after contrast extravasation while in [facility] -  left upper arm with approximately 50 ml of NS and 20 ml of contrast.  Exam: There is mild swelling at the left upper arm area.  There is no erythema. There is no discoloration. There are no blisters. There are no signs of decreased perfusion of the skin.  It is appropriately warm to touch.  The patient has full ROM in fingers.  Radial pulse is normal.  Per contrast extravasation protocol, I have instructed the patient to keep an ice pack on the area for 20-60 minutes at a time for about 48 hours.   Keep arm elevated as much as possible.   The patient understands to call the radiology department if there is: - increase in pain or swelling - changed or altered sensation - ulceration or blistering - increasing redness - warmth or increasing firmness - decreased tissue perfusion as noted by decreased capillary refill or discoloration of skin - decreased pulses peripheral to site   Mickie Kay, NP 08/28/2023 8:45 AM

## 2023-08-28 NOTE — Progress Notes (Signed)
 Patient ID: Lydia Jones, female   DOB: 04-07-1976, 48 y.o.   MRN: [id number] End of ct heart iv left antecubital swollen painful to touch. Pt seen by physicians asistant  [facility]. Pt okayed for discharge. Ice applied

## 2023-09-05 ENCOUNTER — Other Ambulatory Visit: Payer: Self-pay

## 2023-09-05 MED ORDER — METOPROLOL SUCCINATE ER 50 MG PO TB24: 50.0000 mg | ORAL_TABLET | Freq: Every day | ORAL | 3 refills | Status: DC

## 2023-09-05 MED ORDER — METOPROLOL SUCCINATE ER 50 MG PO TB24: 50.0000 mg | ORAL_TABLET | Freq: Every morning | ORAL | 3 refills | Status: DC

## 2023-09-05 NOTE — Progress Notes (Signed)
 Switched pt from Lopressor 25 mg BID to Toprol-XL 50 mg once daily in AM. Pt is aware.

## 2023-10-22 DIAGNOSIS — Z79899 Other long term (current) drug therapy: Secondary | ICD-10-CM | POA: Diagnosis not present

## 2023-10-22 DIAGNOSIS — H04123 Dry eye syndrome of bilateral lacrimal glands: Secondary | ICD-10-CM | POA: Diagnosis not present

## 2023-10-22 DIAGNOSIS — H52203 Unspecified astigmatism, bilateral: Secondary | ICD-10-CM | POA: Diagnosis not present

## 2023-10-22 DIAGNOSIS — H5213 Myopia, bilateral: Secondary | ICD-10-CM | POA: Diagnosis not present

## 2024-01-07 DIAGNOSIS — M5136 Other intervertebral disc degeneration, lumbar region with discogenic back pain only: Secondary | ICD-10-CM | POA: Diagnosis not present

## 2024-01-07 DIAGNOSIS — I7300 Raynaud's syndrome without gangrene: Secondary | ICD-10-CM | POA: Diagnosis not present

## 2024-01-07 DIAGNOSIS — M3501 Sicca syndrome with keratoconjunctivitis: Secondary | ICD-10-CM | POA: Diagnosis not present

## 2024-01-07 DIAGNOSIS — M1991 Primary osteoarthritis, unspecified site: Secondary | ICD-10-CM | POA: Diagnosis not present

## 2024-04-09 ENCOUNTER — Other Ambulatory Visit: Payer: Self-pay | Admitting: Cardiology

## 2024-04-09 MED ORDER — METOPROLOL SUCCINATE ER 50 MG PO TB24: 50.0000 mg | ORAL_TABLET | Freq: Every morning | ORAL | 0 refills | Status: AC

## 2024-05-04 DIAGNOSIS — Z124 Encounter for screening for malignant neoplasm of cervix: Secondary | ICD-10-CM | POA: Diagnosis not present

## 2024-05-04 DIAGNOSIS — Z01419 Encounter for gynecological examination (general) (routine) without abnormal findings: Secondary | ICD-10-CM | POA: Diagnosis not present

## 2024-05-04 DIAGNOSIS — Z1231 Encounter for screening mammogram for malignant neoplasm of breast: Secondary | ICD-10-CM | POA: Diagnosis not present

## 2024-05-04 DIAGNOSIS — Z6834 Body mass index (BMI) 34.0-34.9, adult: Secondary | ICD-10-CM | POA: Diagnosis not present

## 2024-05-07 DIAGNOSIS — M3500 Sicca syndrome, unspecified: Secondary | ICD-10-CM | POA: Diagnosis not present

## 2024-05-07 DIAGNOSIS — H524 Presbyopia: Secondary | ICD-10-CM | POA: Diagnosis not present

## 2024-05-07 DIAGNOSIS — H04123 Dry eye syndrome of bilateral lacrimal glands: Secondary | ICD-10-CM | POA: Diagnosis not present
